# Patient Record
Sex: Female | Born: 1955
Health system: Southern US, Community
[De-identification: ages and names within clinical notes are randomized; demographics above are authoritative.]

## PROBLEM LIST (undated history)

## (undated) DIAGNOSIS — F3289 Other specified depressive episodes: Secondary | ICD-10-CM

## (undated) DIAGNOSIS — R002 Palpitations: Secondary | ICD-10-CM

## (undated) DIAGNOSIS — H409 Unspecified glaucoma: Secondary | ICD-10-CM

## (undated) DIAGNOSIS — I493 Ventricular premature depolarization: Secondary | ICD-10-CM

## (undated) DIAGNOSIS — R5383 Other fatigue: Secondary | ICD-10-CM

## (undated) DIAGNOSIS — G4700 Insomnia, unspecified: Secondary | ICD-10-CM

## (undated) DIAGNOSIS — F411 Generalized anxiety disorder: Secondary | ICD-10-CM

## (undated) DIAGNOSIS — R5381 Other malaise: Secondary | ICD-10-CM

## (undated) DIAGNOSIS — F329 Major depressive disorder, single episode, unspecified: Secondary | ICD-10-CM

## (undated) DIAGNOSIS — K5792 Diverticulitis of intestine, part unspecified, without perforation or abscess without bleeding: Secondary | ICD-10-CM

## (undated) HISTORY — DX: Generalized anxiety disorder: F41.1

## (undated) HISTORY — DX: Palpitations: R00.2

## (undated) HISTORY — DX: Other specified depressive episodes: F32.89

## (undated) HISTORY — DX: Diverticulitis of intestine, part unspecified, without perforation or abscess without bleeding: K57.92

## (undated) HISTORY — DX: Other malaise: R53.81

## (undated) HISTORY — DX: Insomnia, unspecified: G47.00

## (undated) HISTORY — PX: OTHER SURGICAL HISTORY: SHX169

## (undated) HISTORY — DX: Other malaise: R53.83

## (undated) HISTORY — DX: Major depressive disorder, single episode, unspecified: F32.9

---

## 1997-10-15 ENCOUNTER — Ambulatory Visit (HOSPITAL_COMMUNITY): Admission: RE | Admit: 1997-10-15 | Discharge: 1997-10-15 | Payer: Self-pay | Admitting: Obstetrics and Gynecology

## 1998-08-11 ENCOUNTER — Other Ambulatory Visit: Admission: RE | Admit: 1998-08-11 | Discharge: 1998-08-11 | Payer: Self-pay | Admitting: Obstetrics and Gynecology

## 1998-08-11 ENCOUNTER — Ambulatory Visit (HOSPITAL_COMMUNITY): Admission: RE | Admit: 1998-08-11 | Discharge: 1998-08-11 | Payer: Self-pay | Admitting: Orthopedic Surgery

## 1998-08-11 ENCOUNTER — Encounter: Payer: Self-pay | Admitting: Orthopedic Surgery

## 1999-01-02 ENCOUNTER — Ambulatory Visit (HOSPITAL_COMMUNITY): Admission: RE | Admit: 1999-01-02 | Discharge: 1999-01-02 | Payer: Self-pay | Admitting: Obstetrics and Gynecology

## 1999-01-22 ENCOUNTER — Encounter: Payer: Self-pay | Admitting: Obstetrics and Gynecology

## 1999-01-22 ENCOUNTER — Ambulatory Visit (HOSPITAL_COMMUNITY): Admission: RE | Admit: 1999-01-22 | Discharge: 1999-01-22 | Payer: Self-pay | Admitting: Obstetrics and Gynecology

## 1999-10-19 ENCOUNTER — Other Ambulatory Visit: Admission: RE | Admit: 1999-10-19 | Discharge: 1999-10-19 | Payer: Self-pay | Admitting: Obstetrics and Gynecology

## 2000-06-13 ENCOUNTER — Ambulatory Visit (HOSPITAL_COMMUNITY): Admission: RE | Admit: 2000-06-13 | Discharge: 2000-06-13 | Payer: Self-pay | Admitting: Obstetrics and Gynecology

## 2000-06-13 ENCOUNTER — Encounter: Payer: Self-pay | Admitting: Obstetrics and Gynecology

## 2000-11-23 ENCOUNTER — Other Ambulatory Visit: Admission: RE | Admit: 2000-11-23 | Discharge: 2000-11-23 | Payer: Self-pay | Admitting: Obstetrics and Gynecology

## 2001-06-15 ENCOUNTER — Encounter: Admission: RE | Admit: 2001-06-15 | Discharge: 2001-06-15 | Payer: Self-pay | Admitting: Obstetrics and Gynecology

## 2001-06-15 ENCOUNTER — Encounter: Payer: Self-pay | Admitting: Obstetrics and Gynecology

## 2002-01-02 ENCOUNTER — Other Ambulatory Visit: Admission: RE | Admit: 2002-01-02 | Discharge: 2002-01-02 | Payer: Self-pay | Admitting: Obstetrics and Gynecology

## 2002-07-04 ENCOUNTER — Encounter: Admission: RE | Admit: 2002-07-04 | Discharge: 2002-07-04 | Payer: Self-pay | Admitting: Obstetrics and Gynecology

## 2002-07-04 ENCOUNTER — Encounter: Payer: Self-pay | Admitting: Obstetrics and Gynecology

## 2003-02-27 ENCOUNTER — Other Ambulatory Visit: Admission: RE | Admit: 2003-02-27 | Discharge: 2003-02-27 | Payer: Self-pay | Admitting: Obstetrics and Gynecology

## 2003-07-26 ENCOUNTER — Encounter: Admission: RE | Admit: 2003-07-26 | Discharge: 2003-07-26 | Payer: Self-pay | Admitting: Obstetrics and Gynecology

## 2003-12-11 ENCOUNTER — Ambulatory Visit: Payer: Self-pay | Admitting: Internal Medicine

## 2004-02-07 ENCOUNTER — Ambulatory Visit (HOSPITAL_COMMUNITY): Admission: RE | Admit: 2004-02-07 | Discharge: 2004-02-07 | Payer: Self-pay | Admitting: Gastroenterology

## 2004-05-21 ENCOUNTER — Other Ambulatory Visit: Admission: RE | Admit: 2004-05-21 | Discharge: 2004-05-21 | Payer: Self-pay | Admitting: Obstetrics and Gynecology

## 2005-01-29 ENCOUNTER — Encounter: Admission: RE | Admit: 2005-01-29 | Discharge: 2005-01-29 | Payer: Self-pay | Admitting: Obstetrics and Gynecology

## 2006-01-31 ENCOUNTER — Encounter: Admission: RE | Admit: 2006-01-31 | Discharge: 2006-01-31 | Payer: Self-pay | Admitting: Obstetrics and Gynecology

## 2006-11-25 ENCOUNTER — Encounter: Admission: RE | Admit: 2006-11-25 | Discharge: 2006-11-25 | Payer: Self-pay | Admitting: Obstetrics and Gynecology

## 2007-07-03 ENCOUNTER — Ambulatory Visit: Payer: Self-pay | Admitting: Internal Medicine

## 2007-07-03 DIAGNOSIS — G4700 Insomnia, unspecified: Secondary | ICD-10-CM

## 2007-07-03 DIAGNOSIS — F411 Generalized anxiety disorder: Secondary | ICD-10-CM | POA: Insufficient documentation

## 2007-07-03 DIAGNOSIS — R5383 Other fatigue: Secondary | ICD-10-CM

## 2007-07-03 DIAGNOSIS — R5381 Other malaise: Secondary | ICD-10-CM

## 2007-07-03 DIAGNOSIS — F329 Major depressive disorder, single episode, unspecified: Secondary | ICD-10-CM

## 2007-07-04 LAB — CONVERTED CEMR LAB
AST: 26 units/L (ref 0–37)
BUN: 8 mg/dL (ref 6–23)
Basophils Absolute: 0 10*3/uL (ref 0.0–0.1)
Basophils Relative: 0.8 % (ref 0.0–1.0)
CO2: 28 meq/L (ref 19–32)
Calcium: 9.6 mg/dL (ref 8.4–10.5)
Eosinophils Absolute: 0.1 10*3/uL (ref 0.0–0.7)
Eosinophils Relative: 1.4 % (ref 0.0–5.0)
GFR calc Af Amer: 96 mL/min
GFR calc non Af Amer: 79 mL/min
HCT: 40.6 % (ref 36.0–46.0)
Hemoglobin: 14.2 g/dL (ref 12.0–15.0)
Lymphocytes Relative: 13.9 % (ref 12.0–46.0)
MCHC: 35 g/dL (ref 30.0–36.0)
MCV: 92.6 fL (ref 78.0–100.0)
Monocytes Absolute: 0.6 10*3/uL (ref 0.1–1.0)
Monocytes Relative: 10.2 % (ref 3.0–12.0)
Neutro Abs: 4.6 10*3/uL (ref 1.4–7.7)
Nitrite: NEGATIVE
Platelets: 213 10*3/uL (ref 150–400)
RDW: 13.1 % (ref 11.5–14.6)
Specific Gravity, Urine: <=1.005 (ref 1.000–1.03)
TSH: 2.03 microintl units/mL (ref 0.35–5.50)
Total Protein, Urine: NEGATIVE mg/dL
Urine Glucose: NEGATIVE mg/dL
Urobilinogen, UA: 0.2 (ref 0.0–1.0)

## 2008-01-04 ENCOUNTER — Encounter: Admission: RE | Admit: 2008-01-04 | Discharge: 2008-01-04 | Payer: Self-pay | Admitting: Obstetrics and Gynecology

## 2008-02-05 ENCOUNTER — Telehealth: Payer: Self-pay | Admitting: Internal Medicine

## 2008-02-27 ENCOUNTER — Telehealth: Payer: Self-pay | Admitting: Internal Medicine

## 2008-02-27 DIAGNOSIS — R002 Palpitations: Secondary | ICD-10-CM

## 2008-02-27 DIAGNOSIS — I493 Ventricular premature depolarization: Secondary | ICD-10-CM | POA: Insufficient documentation

## 2008-03-01 ENCOUNTER — Encounter: Payer: Self-pay | Admitting: Cardiology

## 2010-02-15 ENCOUNTER — Encounter: Payer: Self-pay | Admitting: Obstetrics and Gynecology

## 2010-06-12 NOTE — Op Note (Signed)
[facility]  Patient:    Isabel Adams                       MRN: [id number] Proc. Date: 01/02/99 Adm. Date:  [id number] Attending:  Frederich Balding                           Operative Report  PREOPERATIVE DIAGNOSIS:       Pelvic pain.  POSTOPERATIVE DIAGNOSIS:      Endometriosis.  OPERATION:                    Open laparoscopy with cautery of endometriotic implants.  SURGEON:                      Juluis Mire, M.D.  ASSISTANT:  ANESTHESIA:                   General endotracheal anesthesia.  ESTIMATED BLOOD LOSS:         Minimal.  PACKS AND DRAINS:             None.  BLOOD REPLACED:               None.  COMPLICATIONS:                None.  INDICATIONS:                  Dictated in the history and physical.  DESCRIPTION OF PROCEDURE:     The patient was taken to the OR and placed in the  supine position.  After a satisfactory level of general endotracheal anesthesia was obtained, the patient was placed in the dorsal lithotomy position using the Allen stirrups.  The abdomen, perineum, and vagina were prepped with Betadine. Examination under anesthesia revealed the uterus to be posterior, upper limits f normal size, adnexa unremarkable.  The bladder was emptied by in-and-out catheterization.  Hulka tenaculum was put in place.  The patient draped out for  laparoscopy.  A subumbilical incision was made with the knife.  The incision was extended through the subcutaneous tissue.  The fascia was identified and entered sharply.  The incision was extended laterally.  The peritoneum was identified and entered sharply.  The Hasson cannula was put in place and secured with two lateral sutures of 0 Vicryl.  The abdomen was inflated with carbon dioxide.  The operating laparoscope was then introduced.  There was no evidence of perumbilical adhesions or injury to adjacent organs.  The 5 mm trocar was put in place in the suprapubic area  under direct visualization.  Visualization revealed the appendix to be surgically absent.  There were some pericecal adhesions.  The upper abdomen including the liver and tip of the gallbladder were unremarkable.  Both lateral  gutters were otherwise clear.  The uterus was retroverted, upper limits of normal size, irregular consistent with adenomyosis.  She had a peritoneal defect in the left side of the cul-de-sac near the uterosacral ligament.  This was cauterized and had superficial implants of endometriosis on the left ovary.  The right ovary was otherwise clear.  There was no other evidence of active endometriosis.  All areas were adequately cauterized.  No evidence of injury to adjacent organs.  The abdomen was deflated of its carbon dioxide.  All trocars were removed.  The subumbilical fascia was closed with figure-of-eight suture of  0 Vicryl.  The skin was closed  with interrupted subcuticulars of 4-0 Vicryl.  The suprapubic incision was closed with Steri-Strips.  The Hulka tenaculum was then removed.  The patient taken out of the dorsal lithotomy position and once alert and extubated, transferred to the recovery room in good condition.  Sponge, needle, and instrument counts were correct by circulating nurse. DD:  01/02/99 TD:  01/04/99 Job: [id number]

## 2011-01-06 ENCOUNTER — Encounter: Payer: Self-pay | Admitting: Cardiovascular Disease

## 2011-01-06 ENCOUNTER — Encounter: Payer: Self-pay | Admitting: *Deleted

## 2011-01-07 ENCOUNTER — Ambulatory Visit (INDEPENDENT_AMBULATORY_CARE_PROVIDER_SITE_OTHER): Payer: Self-pay | Admitting: Cardiovascular Disease

## 2011-01-07 ENCOUNTER — Encounter: Payer: Self-pay | Admitting: Cardiovascular Disease

## 2011-01-07 DIAGNOSIS — F411 Generalized anxiety disorder: Secondary | ICD-10-CM

## 2011-01-07 DIAGNOSIS — R002 Palpitations: Secondary | ICD-10-CM

## 2011-01-07 DIAGNOSIS — R079 Chest pain, unspecified: Secondary | ICD-10-CM | POA: Insufficient documentation

## 2011-01-07 NOTE — Assessment & Plan Note (Signed)
Atypical with normal ECG and exam  F/U stress echo

## 2011-01-07 NOTE — Progress Notes (Signed)
Anxious 55 yo with no previous cardiac history.  48 hrs of atypical chest pain, fatigue and malaise  Started on Tuesday while playing tennis.  Pain started in arm and went to chest and shoulders.  Had malaise.  No pleurit pain or dyspnea.  Has had occasional palpitations or fluttering in chest.  No syncope.  Plays tennis 2-3 x/week usually and no issues or problems.  Has had palpitatoins in past with no identifiable cardiac issues.  Sees [facility] as primary  Denies history of thyroid problems.  Feels normal and asymptomatic today   ROS: Denies fever, malais, weight loss, blurry vision, decreased visual acuity, cough, sputum, SOB, hemoptysis, pleuritic pain, palpitaitons, heartburn, abdominal pain, melena, lower extremity edema, claudication, or rash.  All other systems reviewed and negative   General: Affect appropriate Healthy:  appears stated age HEENT: normal Neck supple with no adenopathy JVP normal no bruits no thyromegaly Lungs clear with no wheezing and good diaphragmatic motion Heart:  S1/S2 no murmur,rub, gallop or click PMI normal Abdomen: benighn, BS positve, no tenderness, no AAA no bruit.  No HSM or HJR Distal pulses intact with no bruits No edema Neuro non-focal Skin warm and dry No muscular weakness  Medications Current Outpatient Prescriptions  Medication Sig Dispense Refill  . ALPRAZolam (XANAX) 0.5 MG tablet Take 0.5 mg by mouth at bedtime as needed.        . cholecalciferol (VITAMIN D) 1000 UNITS tablet Take 1,000 Units by mouth daily.          Allergies Paroxetine  Family History: Family History  Problem Relation Age of Onset  . Hypertension    . Bipolar disorder      Social History: History   Social History  . Marital Status: Married    Spouse Name: N/A    Number of Children: N/A  . Years of Education: N/A   Occupational History  . Not on file.   Social History Main Topics  . Smoking status: Never Smoker   . Smokeless tobacco: Not on file  .  Alcohol Use: Not on file  . Drug Use: Not on file  . Sexually Active: Not on file   Other Topics Concern  . Not on file   Social History Narrative  . No narrative on file    Electrocardiogram:   NSR rate 69  Possible LAE othewise normal ECG  Assessment and Plan

## 2011-01-07 NOTE — Assessment & Plan Note (Signed)
Benign and self limited No need for event monitor unless stress echo abnormal

## 2011-01-07 NOTE — Patient Instructions (Signed)
Your physician has requested that you have a stress echocardiogram. For further information please visit [email or web address]. Please follow instruction sheet as given.  Your physician recommends that you schedule a follow-up appointment in: as needed  

## 2011-01-07 NOTE — Assessment & Plan Note (Signed)
Seems to be persistant problem.  F/U primary and continue current meds

## 2011-01-13 ENCOUNTER — Ambulatory Visit (HOSPITAL_BASED_OUTPATIENT_CLINIC_OR_DEPARTMENT_OTHER): Payer: 59 | Admitting: Radiology

## 2011-01-13 ENCOUNTER — Ambulatory Visit (HOSPITAL_COMMUNITY): Payer: 59 | Attending: Cardiology | Admitting: Radiology

## 2011-01-13 DIAGNOSIS — R002 Palpitations: Secondary | ICD-10-CM | POA: Insufficient documentation

## 2011-01-13 DIAGNOSIS — R5381 Other malaise: Secondary | ICD-10-CM | POA: Insufficient documentation

## 2011-01-13 DIAGNOSIS — R072 Precordial pain: Secondary | ICD-10-CM

## 2011-01-13 DIAGNOSIS — R0989 Other specified symptoms and signs involving the circulatory and respiratory systems: Secondary | ICD-10-CM

## 2011-01-13 DIAGNOSIS — R079 Chest pain, unspecified: Secondary | ICD-10-CM | POA: Insufficient documentation

## 2011-01-13 DIAGNOSIS — R5383 Other fatigue: Secondary | ICD-10-CM | POA: Insufficient documentation

## 2011-01-14 ENCOUNTER — Telehealth: Payer: Self-pay | Admitting: Cardiovascular Disease

## 2011-01-14 NOTE — Telephone Encounter (Signed)
New msg Pt is calling about stress test results She said you a can leave a message. She wanted to know what activities she can continue, such as jogging.

## 2011-01-15 NOTE — Telephone Encounter (Signed)
PT  AWARE OF STRESS ECHO RESULTS ./CY 

## 2011-09-09 ENCOUNTER — Telehealth: Payer: Self-pay | Admitting: Cardiovascular Disease

## 2011-09-09 NOTE — Telephone Encounter (Signed)
Notified pt OK to take adderol

## 2011-09-09 NOTE — Telephone Encounter (Signed)
New msg Pt is possibly starting adderal med and she wants to know if it would cause pvc's. Please call

## 2012-10-31 ENCOUNTER — Other Ambulatory Visit: Payer: Self-pay | Admitting: Obstetrics and Gynecology

## 2012-10-31 DIAGNOSIS — N6489 Other specified disorders of breast: Secondary | ICD-10-CM

## 2012-10-31 DIAGNOSIS — N644 Mastodynia: Secondary | ICD-10-CM

## 2012-11-08 ENCOUNTER — Ambulatory Visit
Admission: RE | Admit: 2012-11-08 | Discharge: 2012-11-08 | Disposition: A | Discharge status: Home / Self Care | Payer: 59 | Source: Ambulatory Visit | Attending: Obstetrics and Gynecology | Admitting: Obstetrics and Gynecology

## 2012-11-08 DIAGNOSIS — N644 Mastodynia: Secondary | ICD-10-CM

## 2012-11-08 DIAGNOSIS — N6489 Other specified disorders of breast: Secondary | ICD-10-CM

## 2013-01-24 ENCOUNTER — Encounter: Payer: Self-pay | Admitting: Cardiology

## 2013-07-21 ENCOUNTER — Emergency Department (HOSPITAL_COMMUNITY)
Admission: EM | Admit: 2013-07-21 | Discharge: 2013-07-21 | Disposition: A | Discharge status: Home / Self Care | Payer: 59 | Attending: Emergency Medicine | Admitting: Emergency Medicine

## 2013-07-21 ENCOUNTER — Encounter (HOSPITAL_COMMUNITY): Payer: Self-pay | Admitting: Emergency Medicine

## 2013-07-21 DIAGNOSIS — Z79899 Other long term (current) drug therapy: Secondary | ICD-10-CM | POA: Insufficient documentation

## 2013-07-21 DIAGNOSIS — F329 Major depressive disorder, single episode, unspecified: Secondary | ICD-10-CM | POA: Insufficient documentation

## 2013-07-21 DIAGNOSIS — H409 Unspecified glaucoma: Secondary | ICD-10-CM | POA: Insufficient documentation

## 2013-07-21 DIAGNOSIS — Z8679 Personal history of other diseases of the circulatory system: Secondary | ICD-10-CM | POA: Insufficient documentation

## 2013-07-21 DIAGNOSIS — F3289 Other specified depressive episodes: Secondary | ICD-10-CM | POA: Insufficient documentation

## 2013-07-21 DIAGNOSIS — F411 Generalized anxiety disorder: Secondary | ICD-10-CM | POA: Insufficient documentation

## 2013-07-21 DIAGNOSIS — N390 Urinary tract infection, site not specified: Secondary | ICD-10-CM

## 2013-07-21 HISTORY — DX: Ventricular premature depolarization: I49.3

## 2013-07-21 HISTORY — DX: Unspecified glaucoma: H40.9

## 2013-07-21 LAB — URINALYSIS, ROUTINE W REFLEX MICROSCOPIC
Bilirubin Urine: NEGATIVE
GLUCOSE, UA: NEGATIVE mg/dL
Ketones, ur: NEGATIVE mg/dL
NITRITE: NEGATIVE
PROTEIN: 100 mg/dL — AB
SPECIFIC GRAVITY, URINE: 1.021 (ref 1.005–1.030)
Urobilinogen, UA: 1 mg/dL (ref 0.0–1.0)
pH: 6 (ref 5.0–8.0)

## 2013-07-21 LAB — URINE MICROSCOPIC-ADD ON

## 2013-07-21 MED ORDER — PHENAZOPYRIDINE HCL 200 MG PO TABS: 200.0000 mg | ORAL_TABLET | Freq: Three times a day (TID) | ORAL | Status: DC | PRN

## 2013-07-21 MED ORDER — CEPHALEXIN 500 MG PO CAPS
1000.0000 mg | ORAL_CAPSULE | Freq: Once | ORAL | Status: AC
  Administered 2013-07-21: 1000 mg via ORAL
  Filled 2013-07-21: qty 2

## 2013-07-21 MED ORDER — PHENAZOPYRIDINE HCL 200 MG PO TABS
200.0000 mg | ORAL_TABLET | Freq: Three times a day (TID) | ORAL | Status: DC
  Administered 2013-07-21: 200 mg via ORAL
  Filled 2013-07-21: qty 1

## 2013-07-21 MED ORDER — CEPHALEXIN 500 MG PO CAPS: 500.0000 mg | ORAL_CAPSULE | Freq: Three times a day (TID) | ORAL | Status: DC

## 2013-07-21 NOTE — ED Notes (Signed)
Pt states she thinks she has a UTI  Pt states yesterday morning she started having some burning with urination  Pt states it has progressively gotten worse throughout the day and tonight she started having blood in her urine  Pt states she is having a great amt of discomfort

## 2013-07-21 NOTE — ED Notes (Signed)
MD at bedside. 

## 2013-07-21 NOTE — ED Provider Notes (Signed)
CSN: [id number]     Arrival date & time 07/21/13  0202 History   First MD Initiated Contact with Patient 07/21/13 0305     Chief Complaint  Patient presents with  . Hematuria     (Consider location/radiation/quality/duration/timing/severity/associated sxs/prior Treatment) HPI 58 yo woman with about 12 hours of urinary frequency, burning dysuria, hesitancy and slight amount of gross hematuria. No fever. Mildly nauseated. No vomiting. Pain is worse with urination. No fever.   Past Medical History  Diagnosis Date  . Palpitations   . INSOMNIA, PERSISTENT   . FATIGUE   . DEPRESSION   . ANXIETY   . Chest pain   . PVC (premature ventricular contraction)   . Glaucoma    Past Surgical History  Procedure Laterality Date  . Laproscopy    . Ovarian cyst removed     Family History  Problem Relation Age of Onset  . Hypertension    . Bipolar disorder    . Hypertension Mother    History  Substance Use Topics  . Smoking status: Never Smoker   . Smokeless tobacco: Not on file  . Alcohol Use: Yes     Comment: occ   OB History   Grav Para Term Preterm Abortions TAB SAB Ect Mult Living                 Review of Systems 10 point ROS obtained and is negative with the exception of sx noted ablve.     Allergies  Paroxetine  Home Medications   Prior to Admission medications   Medication Sig Start Date End Date Taking? Authorizing Provider  acetaminophen (TYLENOL) 500 MG tablet Take 1,000 mg by mouth every 6 (six) hours as needed for moderate pain.   Yes Historical Provider, MD  ALPRAZolam Prudy Feeler(XANAX) 0.5 MG tablet Take 0.5 mg by mouth at bedtime as needed for anxiety.    Yes Historical Provider, MD   BP 114/73  Temp(Src) 97.7 F (36.5 C) (Oral)  Resp 20  SpO2 97% Physical Exam  Constitutional: She is oriented to person, place, and time. She appears well-developed and well-nourished.  HENT:  Head: Normocephalic and atraumatic.  Eyes: Conjunctivae and EOM are normal. Pupils are  equal, round, and reactive to light.  Neck: Neck supple.  Cardiovascular: Normal rate, regular rhythm and normal heart sounds.   Pulmonary/Chest: Effort normal and breath sounds normal.  Abdominal: Soft. She exhibits no distension. There is no tenderness.  Musculoskeletal: She exhibits no edema.  Neurological: She is alert and oriented to person, place, and time.  Skin: Skin is warm and dry.  Psychiatric: She has a normal mood and affect.   \  ED Course  Procedures (including critical care time) Labs Review Labs Reviewed  URINALYSIS, ROUTINE W REFLEX MICROSCOPIC - Abnormal; Notable for the following:    Color, Urine BROWN (*)    APPearance TURBID (*)    Hgb urine dipstick LARGE (*)    Protein, ur 100 (*)    Leukocytes, UA LARGE (*)    All other components within normal limits  URINE MICROSCOPIC-ADD ON - Abnormal; Notable for the following:    Bacteria, UA MANY (*)    All other components within normal limits  URINE CULTURE     MDM   Patient with simple UTI. We will tx empirically with Keflex - 1st dose in ED, along with Pyridium. Urine sent for C and S. Patient given return precautions.     Brandt LoosenJulie Shelbee Apgar, MD 07/26/13 [phone]

## 2013-07-23 LAB — URINE CULTURE: Colony Count: >=100000

## 2013-07-28 ENCOUNTER — Telehealth (HOSPITAL_BASED_OUTPATIENT_CLINIC_OR_DEPARTMENT_OTHER): Payer: Self-pay | Admitting: Emergency Medicine

## 2013-07-28 NOTE — Telephone Encounter (Signed)
Post ED Visit - Positive Culture Follow-up  Culture report reviewed by antimicrobial stewardship pharmacist: []  Wes Dulaney, Pharm.D., BCPS []  Celedonio MiyamotoJeremy Frens, Pharm.D., BCPS [x]  Georgina PillionElizabeth Martin, Pharm.D., BCPS []  De SotoMinh Pham, [address] Rainbow BoulevardPharm.D., BCPS, AAHIVP []  Estella HuskMichelle Turner, Pharm.D., BCPS, AAHIVP  Positive urine culture Treated with Keflex, organism sensitive to the same and no further patient follow-up is required at this time.  Marcelle OverlieHolland, Jenel LucksKylie 07/28/2013, 10:33 AM

## 2017-12-20 ENCOUNTER — Ambulatory Visit: Payer: 59 | Admitting: Family Medicine

## 2017-12-20 DIAGNOSIS — M7632 Iliotibial band syndrome, left leg: Secondary | ICD-10-CM | POA: Diagnosis not present

## 2017-12-20 NOTE — Progress Notes (Signed)
Isabel Adams [address] [place], [place] [address] Phone: [phone] Subjective:     CC: Left knee pain  [id number]  Isabel Adams is a 62 y.o. female coming in with complaint of left knee pain. She was in the yard with her dog yesterday when she felt her knee tighten up. She said that she has had this happen to her before. Pain is over lateral aspect of knee with weight bearing. Pain is 0/10 today. Was using turmeric and stopped taking it.  Patient states overall most of time a mild discomfort but sometimes can get severe enough that stops her from activity.       Past Medical History:  Diagnosis Date  . ANXIETY   . Chest pain   . DEPRESSION   . FATIGUE   . Glaucoma   . INSOMNIA, PERSISTENT   . Palpitations   . PVC (premature ventricular contraction)    Past Surgical History:  Procedure Laterality Date  . laproscopy    . ovarian cyst removed     Social History   Socioeconomic History  . Marital status: Married    Spouse name: Not on file  . Number of children: Not on file  . Years of education: Not on file  . Highest education level: Not on file  Occupational History  . Not on file  Social Needs  . Financial resource strain: Not on file  . Food insecurity:    Worry: Not on file    Inability: Not on file  . Transportation needs:    Medical: Not on file    Non-medical: Not on file  Tobacco Use  . Smoking status: Never Smoker  Substance and Sexual Activity  . Alcohol use: Yes    Comment: occ  . Drug use: No  . Sexual activity: Not on file  Lifestyle  . Physical activity:    Days per week: Not on file    Minutes per session: Not on file  . Stress: Not on file  Relationships  . Social connections:    Talks on phone: Not on file    Gets together: Not on file    Attends religious service: Not on file    Active member of club or organization: Not on file    Attends meetings of clubs or organizations: Not on file   Relationship status: Not on file  Other Topics Concern  . Not on file  Social History Narrative  . Not on file   Allergies  Allergen Reactions  . Paroxetine     REACTION: rash   Family History  Problem Relation Age of Onset  . Hypertension Unknown   . Bipolar disorder Unknown   . Hypertension Mother        Current Outpatient Medications (Analgesics):  .  acetaminophen (TYLENOL) 500 MG tablet, Take 1,000 mg by mouth every 6 (six) hours as needed for moderate pain.   Current Outpatient Medications (Other):  Marland Kitchen  ALPRAZolam (XANAX) 0.5 MG tablet, Take 0.5 mg by mouth at bedtime as needed for anxiety.  .  cephALEXin (KEFLEX) 500 MG capsule, Take 1 capsule (500 mg total) by mouth 3 (three) times daily. .  phenazopyridine (PYRIDIUM) 200 MG tablet, Take 1 tablet (200 mg total) by mouth 3 (three) times daily as needed for pain.    Past medical history, social, surgical and family history all reviewed in electronic medical record.  No pertanent information unless stated regarding to the chief complaint.  Review of Systems:  No headache, visual changes, nausea, vomiting, diarrhea, constipation, dizziness, abdominal pain, skin rash, fevers, chills, night sweats, weight loss, swollen lymph nodes, body aches, joint swelling, muscle aches, chest pain, shortness of breath, mood changes.   Objective  Blood pressure 118/72, pulse 63, height 5\' 9"  (1.753 m), weight 149 lb (67.6 kg), SpO2 98 %.    General: No apparent distress alert and oriented x3 mood and affect normal, dressed appropriately.  HEENT: Pupils equal, extraocular movements intact  Respiratory: Patient's speak in full sentences and does not appear short of breath  Cardiovascular: No lower extremity edema, non tender, no erythema  Skin: Warm dry intact with no signs of infection or rash on extremities or on axial skeleton.  Abdomen: Soft nontender  Neuro: Cranial nerves II through XII are intact, neurovascularly intact in all  extremities with 2+ DTRs and 2+ pulses.  Lymph: No lymphadenopathy of posterior or anterior cervical chain or axillae bilaterally.  Gait normal with good balance and coordination.  MSK:  Non tender with full range of motion and good stability and symmetric strength and tone of shoulders, elbows, wrist, hip, and ankles bilaterally.  Knee: Left Normal to inspection with no erythema or effusion or obvious bony abnormalities. Palpation normal with no warmth, joint line tenderness, patellar tenderness, or condyle tenderness. ROM full in flexion and extension and lower leg rotation. Ligaments with solid consistent endpoints including ACL, PCL, LCL, MCL. Negative Mcmurray's, Apley's, and Thessalonian tests. Non painful patellar compression. Patellar glide without crepitus. Patellar and quadriceps tendons unremarkable. Hamstring and quadriceps strength is normal. Tightness with Pearlean BrownieFaber test on the left side    Contralateral knee unremarkable  MSK US performed of: Left knee This study was ordered, performed, and interpreted by Terrilee FilesZach  D.O.  Knee: Patient does have manias partial tearing at the insertion of the IT band and near the LCL.  LCL does appear to be intact.  Minimal arthritic changes noted of the knee overall.  No joint effusion noted. Impression: Chronic IT band syndrome  [id number]; 15 additional minutes spent for Therapeutic exercises as stated in above notes.  This included exercises focusing on stretching, strengthening, with significant focus on eccentric aspects.   Long term goals include an improvement in range of motion, strength, endurance as well as avoiding reinjury. Patient's frequency would include in 1-2 times a day, 3-5 times a week for a duration of 6-12 weeks. I reviewed with the patient the anatomy involved with ITB syndrome.  Given rehab program  - primarily ITB stretching program, core stability program, gluteus medius strengthening, hip flexion strengthening, and core.  Additional proprioception and mild plyometrics program reviewed.  Reviewed plan of care and rehab is the primary treatment in this condition.  Proper technique shown and discussed handout in great detail with ATC.  All questions were discussed and answered.     Impression and Recommendations:     This case required medical decision making of moderate complexity. The above documentation has been reviewed and is accurate and complete Judi SaaZachary M , DO       Note: This dictation was prepared with Dragon dictation along with smaller phrase technology. Any transcriptional errors that result from this process are unintentional.

## 2017-12-20 NOTE — Assessment & Plan Note (Signed)
IT band syndrome.  Seems to be more distal.  Discussed icing regimen and home exercise.  Discussed which activities to do which wants to avoid.  Patient will increase activity slowly over the course the next several weeks.  Follow-up with me again in 4 to 8 weeks.

## 2017-12-20 NOTE — Patient Instructions (Signed)
Good to see you  A little tear of the IT band, the knee looks good Ice 20 minutes 2 times daily. Usually after activity and before bed. Exercises 3 times a week.  pennsaid pinkie amount topically 2 times daily as needed.  Over the counter get  Vitamin D 2000 IU daily  Turmeric 500mg  daily  Elliptical may be better than walking  See me again in 4-6 weeks Happy holidays!

## 2018-01-23 ENCOUNTER — Other Ambulatory Visit: Payer: Self-pay | Admitting: Radiology

## 2018-01-23 ENCOUNTER — Ambulatory Visit
Admission: RE | Admit: 2018-01-23 | Discharge: 2018-01-23 | Disposition: A | Discharge status: Home / Self Care | Payer: 59 | Source: Ambulatory Visit | Attending: Radiology | Admitting: Radiology

## 2018-01-23 DIAGNOSIS — N6489 Other specified disorders of breast: Secondary | ICD-10-CM

## 2018-01-24 ENCOUNTER — Other Ambulatory Visit: Payer: 59

## 2018-01-30 ENCOUNTER — Telehealth: Payer: Self-pay

## 2018-01-30 NOTE — Telephone Encounter (Signed)
Copied from CRM [id number]. Topic: Appointment Scheduling - Scheduling Inquiry for Clinic >> Jan 30, 2018 12:10 PM Angela Nevin wrote: Reason for CRM: Patient would like to know if Dr. Katrinka Blazing would be willing to work her in later in the day on 01/10. Patient is scheduled for 11:30. Please advise.

## 2018-01-30 NOTE — Telephone Encounter (Signed)
Spoke with patient about rescheduling but Friday at 11:30pm does work for her. No changes made.

## 2018-01-31 ENCOUNTER — Ambulatory Visit: Payer: 59 | Admitting: Family Medicine

## 2018-02-01 NOTE — Progress Notes (Signed)
[facility] [address] [place], [place] [address] Phone: [phone] Subjective:       CC: Leg and hip pain follow-up  DQQ:[id number]  Isabel Adams is a 63 y.o. female coming in with complaint of patient was more of a chronic IT band syndrome.  Patient was given different exercises, discussed over-the-counter medications, icing regimen.  Patient states that her pain is improving but patient is frustrated as she feels like she should be better. A couple of weeks ago she felt a catching on the lateral aspect of the knee. Patient has been favoring her left leg and is now having pain in the right leg. Has tried the exercises a few times.     Past Medical History:  Diagnosis Date  . ANXIETY   . Chest pain   . DEPRESSION   . FATIGUE   . Glaucoma   . INSOMNIA, PERSISTENT   . Palpitations   . PVC (premature ventricular contraction)    Past Surgical History:  Procedure Laterality Date  . laproscopy    . ovarian cyst removed     Social History   Socioeconomic History  . Marital status: Married    Spouse name: Not on file  . Number of children: Not on file  . Years of education: Not on file  . Highest education level: Not on file  Occupational History  . Not on file  Social Needs  . Financial resource strain: Not on file  . Food insecurity:    Worry: Not on file    Inability: Not on file  . Transportation needs:    Medical: Not on file    Non-medical: Not on file  Tobacco Use  . Smoking status: Never Smoker  Substance and Sexual Activity  . Alcohol use: Yes    Comment: occ  . Drug use: No  . Sexual activity: Not on file  Lifestyle  . Physical activity:    Days per week: Not on file    Minutes per session: Not on file  . Stress: Not on file  Relationships  . Social connections:    Talks on phone: Not on file    Gets together: Not on file    Attends religious service: Not on file    Active member of club or organization: Not on  file    Attends meetings of clubs or organizations: Not on file    Relationship status: Not on file  Other Topics Concern  . Not on file  Social History Narrative  . Not on file   Allergies  Allergen Reactions  . Paroxetine     REACTION: rash   Family History  Problem Relation Age of Onset  . Hypertension Other   . Bipolar disorder Other   . Hypertension Mother        Current Outpatient Medications (Analgesics):  .  acetaminophen (TYLENOL) 500 MG tablet, Take 1,000 mg by mouth every 6 (six) hours as needed for moderate pain.   Current Outpatient Medications (Other):  Marland Kitchen  ALPRAZolam (XANAX) 0.5 MG tablet, Take 0.5 mg by mouth at bedtime as needed for anxiety.  .  cephALEXin (KEFLEX) 500 MG capsule, Take 1 capsule (500 mg total) by mouth 3 (three) times daily. .  phenazopyridine (PYRIDIUM) 200 MG tablet, Take 1 tablet (200 mg total) by mouth 3 (three) times daily as needed for pain.    Past medical history, social, surgical and family history all reviewed in electronic medical record.  No pertanent information unless stated regarding to the chief complaint.   Review of Systems:  No headache, visual changes, nausea, vomiting, diarrhea, constipation, dizziness, abdominal pain, skin rash, fevers, chills, night sweats, weight loss, swollen lymph nodes, body aches, joint swelling, , chest pain, shortness of breath, mood changes.  Positive muscle aches  Objective  Blood pressure 118/72, pulse 68, height 5\' 9"  (1.753 m), weight 153 lb (69.4 kg), SpO2 98 %.    General: No apparent distress alert and oriented x3 mood and affect normal, dressed appropriately.  HEENT: Pupils equal, extraocular movements intact  Respiratory: Patient's speak in full sentences and does not appear short of breath  Cardiovascular: No lower extremity edema, non tender, no erythema  Skin: Warm dry intact with no signs of infection or rash on extremities or on axial skeleton.  Abdomen: Soft nontender    Neuro: Cranial nerves II through XII are intact, neurovascularly intact in all extremities with 2+ DTRs and 2+ pulses.  Lymph: No lymphadenopathy of posterior or anterior cervical chain or axillae bilaterally.  Gait antalgic MSK:  Non tender with full range of motion and good stability and symmetric strength and tone of shoulders, elbows, wrist, hip, and ankles bilaterally.  Hip: Left ROM IR: 35 Deg, ER: 25 Deg, Flexion: 80 Deg, Extension: 100 Deg, Abduction: 35 Deg, Adduction: 35 Deg Strength IR: 5/5, ER: 5/5, Flexion: 5/5, Extension: 5/5, Abduction: 5/5, Adduction: 4/5 Pelvic alignment unremarkable to inspection and palpation. Standing hip rotation and gait without trendelenburg sign / unsteadiness. Positive Faber  Left knee-patient still has pain on the lateral aspect of the knee.  Tightness with the [facility] test.  Mild pain over the lateral joint line.  Mild bogginess noted   Ltd msk  Patient is limited US of lateral knee sho possible inflammation vs cyst formation within distal IT band.  Does not appear to be intra-articular. Not associated with meniscus.  IMpressoin: chronic IT band   Procedure: Real-time Ultrasound Guided Injection of left knee cyst lateral  Device: GE Logiq Q7 Ultrasound guided injection is preferred based studies that show increased duration, increased effect, greater accuracy, decreased procedural pain, increased response rate, and decreased cost with ultrasound guided versus blind injection.  Verbal informed consent obtained.  Time-out conducted.  Noted no overlying erythema, induration, or other signs of local infection.  Skin prepped in a sterile fashion.  Local anesthesia: Topical Ethyl chloride.  With sterile technique and under real time ultrasound guidance: With a 22-gauge 2 inch needle patient was injected with 4 cc of 0.5% Marcaine and 1 cc of Kenalog 40 mg/dL. This was from a lateral approach.  Completed without difficulty  Pain immediately resolved  suggesting accurate placement of the medication.  Advised to call if fevers/chills, erythema, induration, drainage, or persistent bleeding.  Images permanently stored and available for review in the ultrasound unit.  Impression: Technically successful ultrasound guided injection.     Impression and Recommendations:     This case required medical decision making of moderate complexity. The above documentation has been reviewed and is accurate and complete Judi Saa, DO       Note: This dictation was prepared with Dragon dictation along with smaller phrase technology. Any transcriptional errors that result from this process are unintentional.

## 2018-02-03 ENCOUNTER — Ambulatory Visit: Payer: Self-pay

## 2018-02-03 ENCOUNTER — Other Ambulatory Visit (INDEPENDENT_AMBULATORY_CARE_PROVIDER_SITE_OTHER): Payer: 59

## 2018-02-03 ENCOUNTER — Ambulatory Visit: Payer: 59 | Admitting: Family Medicine

## 2018-02-03 VITALS — BP 118/72 | HR 68 | Ht 69.0 in | Wt 153.0 lb

## 2018-02-03 DIAGNOSIS — G8929 Other chronic pain: Secondary | ICD-10-CM

## 2018-02-03 DIAGNOSIS — M7632 Iliotibial band syndrome, left leg: Secondary | ICD-10-CM | POA: Diagnosis not present

## 2018-02-03 DIAGNOSIS — M25562 Pain in left knee: Principal | ICD-10-CM

## 2018-02-03 DIAGNOSIS — M2550 Pain in unspecified joint: Secondary | ICD-10-CM

## 2018-02-03 LAB — CBC WITH DIFFERENTIAL/PLATELET
Basophils Absolute: 0 10*3/uL (ref 0.0–0.1)
Basophils Relative: 0.9 % (ref 0.0–3.0)
Eosinophils Absolute: 0.3 10*3/uL (ref 0.0–0.7)
Eosinophils Relative: 5.6 % — ABNORMAL HIGH (ref 0.0–5.0)
HCT: 41.2 % (ref 36.0–46.0)
Hemoglobin: 13.7 g/dL (ref 12.0–15.0)
Lymphocytes Relative: 21.2 % (ref 12.0–46.0)
Lymphs Abs: 1.1 10*3/uL (ref 0.7–4.0)
MCHC: 33.2 g/dL (ref 30.0–36.0)
MCV: 95.1 fl (ref 78.0–100.0)
MONOS PCT: 9 % (ref 3.0–12.0)
Monocytes Absolute: 0.4 10*3/uL (ref 0.1–1.0)
Neutro Abs: 3.1 10*3/uL (ref 1.4–7.7)
Neutrophils Relative %: 63.3 % (ref 43.0–77.0)
Platelets: 235 10*3/uL (ref 150.0–400.0)
RBC: 4.33 Mil/uL (ref 3.87–5.11)
RDW: 13.6 % (ref 11.5–15.5)
WBC: 5 10*3/uL (ref 4.0–10.5)

## 2018-02-03 LAB — COMPREHENSIVE METABOLIC PANEL
ALT: 15 U/L (ref 0–35)
AST: 16 U/L (ref 0–37)
Albumin: 4.4 g/dL (ref 3.5–5.2)
Alkaline Phosphatase: 67 U/L (ref 39–117)
BILIRUBIN TOTAL: 0.4 mg/dL (ref 0.2–1.2)
BUN: 15 mg/dL (ref 6–23)
CO2: 22 mEq/L (ref 19–32)
Calcium: 9.4 mg/dL (ref 8.4–10.5)
Chloride: 110 mEq/L (ref 96–112)
Creatinine, Ser: 0.74 mg/dL (ref 0.40–1.20)
GFR: 83.8 mL/min (ref >60.00)
Glucose, Bld: 106 mg/dL — ABNORMAL HIGH (ref 70–99)
Potassium: 4.6 mEq/L (ref 3.5–5.1)
Sodium: 140 mEq/L (ref 135–145)
Total Protein: 6.6 g/dL (ref 6.0–8.3)

## 2018-02-03 LAB — IBC PANEL
Iron: 100 ug/dL (ref 42–145)
Saturation Ratios: 28.8 % (ref 20.0–50.0)
Transferrin: 248 mg/dL (ref 212.0–360.0)

## 2018-02-03 LAB — C-REACTIVE PROTEIN: CRP: <0.1 mg/dL — ABNORMAL LOW (ref 0.5–20.0)

## 2018-02-03 LAB — SEDIMENTATION RATE: Sed Rate: 2 mm/hr (ref 0–30)

## 2018-02-03 LAB — URIC ACID: Uric Acid, Serum: 4.8 mg/dL (ref 2.4–7.0)

## 2018-02-03 LAB — FERRITIN: Ferritin: 52.4 ng/mL (ref 10.0–291.0)

## 2018-02-03 LAB — TSH: TSH: 2.26 u[IU]/mL (ref 0.35–4.50)

## 2018-02-03 NOTE — Patient Instructions (Addendum)
Good to see you  Ice is yoru friend Take a couple days form the exercise Lets get labs downstairs  Then continue the exercises downstairs See me again in 6 weeks

## 2018-02-06 LAB — RHEUMATOID FACTOR: Rheumatoid fact SerPl-aCnc: 19 IU/mL — ABNORMAL HIGH (ref <14)

## 2018-02-06 LAB — ANGIOTENSIN CONVERTING ENZYME: Angiotensin-Converting Enzyme: 23 U/L (ref 9–67)

## 2018-02-06 LAB — PTH, INTACT AND CALCIUM
Calcium: 9.5 mg/dL (ref 8.6–10.4)
PTH: 62 pg/mL (ref 14–64)

## 2018-02-06 LAB — CYCLIC CITRUL PEPTIDE ANTIBODY, IGG: Cyclic Citrullin Peptide Ab: <16 UNITS

## 2018-02-06 LAB — CALCIUM, IONIZED: Calcium, Ion: 5.21 mg/dL (ref 4.8–5.6)

## 2018-02-06 LAB — ANA: Anti Nuclear Antibody(ANA): NEGATIVE

## 2018-02-11 ENCOUNTER — Encounter: Payer: Self-pay | Admitting: Family Medicine

## 2018-02-28 ENCOUNTER — Telehealth: Payer: Self-pay | Admitting: *Deleted

## 2018-02-28 NOTE — Progress Notes (Signed)
[facility] D[facility] [address] [place], [place] [address] Phone: [phone] Subjective:    I Isabel NighKana Thompson am serving as a Neurosurgeonscribe for Dr. Antoine PrimasZachary Smith.   CC: Left knee pain follow-up  VOZ:[id number]:Subjective    Updated 03/01/2018  Isabel SiresSusan W Adams is a 63 y.o. female coming in with complaint of left knee pain. States she feels about the same. Right leg is sore. Left knee catches and has a burning sensation up into the glut. Patient was seen and had more ones appear to be a peri-meniscal cyst.  Did have aspiration done.  Patient states that the pain seemed to be better initially but now is having some mild worsening again.  Patient states that it seems to be radiating up more.  Seems to be even in the middle of her left buttocks.  No significant back pain that seems to be associated with it though.     Past Medical History:  Diagnosis Date  . ANXIETY   . Chest pain   . DEPRESSION   . FATIGUE   . Glaucoma   . INSOMNIA, PERSISTENT   . Palpitations   . PVC (premature ventricular contraction)    Past Surgical History:  Procedure Laterality Date  . laproscopy    . ovarian cyst removed     Social History   Socioeconomic History  . Marital status: Married    Spouse name: Not on file  . Number of children: Not on file  . Years of education: Not on file  . Highest education level: Not on file  Occupational History  . Not on file  Social Needs  . Financial resource strain: Not on file  . Food insecurity:    Worry: Not on file    Inability: Not on file  . Transportation needs:    Medical: Not on file    Non-medical: Not on file  Tobacco Use  . Smoking status: Never Smoker  Substance and Sexual Activity  . Alcohol use: Yes    Comment: occ  . Drug use: No  . Sexual activity: Not on file  Lifestyle  . Physical activity:    Days per week: Not on file    Minutes per session: Not on file  . Stress: Not on file  Relationships  . Social connections:   Talks on phone: Not on file    Gets together: Not on file    Attends religious service: Not on file    Active member of club or organization: Not on file    Attends meetings of clubs or organizations: Not on file    Relationship status: Not on file  Other Topics Concern  . Not on file  Social History Narrative  . Not on file   Allergies  Allergen Reactions  . Paroxetine     REACTION: rash   Family History  Problem Relation Age of Onset  . Hypertension Other   . Bipolar disorder Other   . Hypertension Mother        Current Outpatient Medications (Analgesics):  .  acetaminophen (TYLENOL) 500 MG tablet, Take 1,000 mg by mouth every 6 (six) hours as needed for moderate pain.   Current Outpatient Medications (Other):  Marland Kitchen.  ALPRAZolam (XANAX) 0.5 MG tablet, Take 0.5 mg by mouth at bedtime as needed for anxiety.  .  cephALEXin (KEFLEX) 500 MG capsule, Take 1 capsule (500 mg total) by mouth 3 (three) times daily. .  phenazopyridine (PYRIDIUM) 200 MG tablet, Take 1  tablet (200 mg total) by mouth 3 (three) times daily as needed for pain. Marland Kitchen  gabapentin (NEURONTIN) 100 MG capsule, Take 2 capsules (200 mg total) by mouth at bedtime.    Past medical history, social, surgical and family history all reviewed in electronic medical record.  No pertanent information unless stated regarding to the chief complaint.   Review of Systems:  No headache, visual changes, nausea, vomiting, diarrhea, constipation, dizziness, abdominal pain, skin rash, fevers, chills, night sweats, weight loss, swollen lymph nodes,chest pain, shortness of breath, mood changes.  Positive muscle aches, body aches  Objective  Blood pressure 102/82, pulse 64, height 5\' 9"  (1.753 m), SpO2 98 %.   General: No apparent distress alert and oriented x3 mood and affect normal, dressed appropriately.  HEENT: Pupils equal, extraocular movements intact  Respiratory: Patient's speak in full sentences and does not appear short of  breath  Cardiovascular: No lower extremity edema, non tender, no erythema  Skin: Warm dry intact with no signs of infection or rash on extremities or on axial skeleton.  Abdomen: Soft nontender  Neuro: Cranial nerves II through XII are intact, neurovascularly intact in all extremities with 2+ DTRs and 2+ pulses.  Lymph: No lymphadenopathy of posterior or anterior cervical chain or axillae bilaterally.  Gait normal with good balance and coordination.  MSK:  Non tender with full range of motion and good stability and symmetric strength and tone of shoulders, elbows, wrist, hip, and ankles bilaterally.  Left knee exam still has some mild tenderness over the lateral joint line but nothing severe.  Near full range of motion.  No significant instability.  Tightness of the iliotibial band.  Some more increased tenderness in the piriformis on the left side.  Negative straight leg test.  Mild pain in the sacroiliac joint on the left side.  Limited musculoskeletal ultrasound was performed and interpreted by Judi Saa  Limited ultrasound of patient's knee shows that the cystic formation that was previously seen seems to be significantly improved.  Patient still has some mild hypoechoic changes surrounding this area.  Seems to be near the fibular head as well as the sural nerve.  No significant abnormality mass or abnormal vascularity noted.   Impression and Recommendations:     This case required medical decision making of moderate complexity. The above documentation has been reviewed and is accurate and complete Judi Saa, DO       Note: This dictation was prepared with Dragon dictation along with smaller phrase technology. Any transcriptional errors that result from this process are unintentional.

## 2018-02-28 NOTE — Telephone Encounter (Signed)
Copied from CRM [id number]. Topic: Appointment Scheduling - Scheduling Inquiry for Clinic >> Feb 27, 2018  2:56 PM [name], [name] wrote: Reason for CRM: pt states she is in a lot of pain. Has been somewhat improved, but now not so much now. Pt states she was hoping to get in tomorrow, 2/4 to see Dr Katrinka BlazingSmith. Pt has appt 2/17 but states she cannot wait that long.  Pt states any time is ok. Just tell her what time

## 2018-02-28 NOTE — Telephone Encounter (Signed)
lmovm for pt to return call.  

## 2018-02-28 NOTE — Telephone Encounter (Signed)
Spoke to pt, scheduled her for 03/01/18 @ 9am.

## 2018-03-01 ENCOUNTER — Ambulatory Visit (INDEPENDENT_AMBULATORY_CARE_PROVIDER_SITE_OTHER)
Admission: RE | Admit: 2018-03-01 | Discharge: 2018-03-01 | Disposition: A | Discharge status: Home / Self Care | Payer: 59 | Source: Ambulatory Visit | Attending: Family Medicine | Admitting: Family Medicine

## 2018-03-01 ENCOUNTER — Ambulatory Visit: Payer: 59 | Admitting: Family Medicine

## 2018-03-01 ENCOUNTER — Ambulatory Visit: Payer: Self-pay

## 2018-03-01 VITALS — BP 102/82 | HR 64 | Ht 69.0 in

## 2018-03-01 DIAGNOSIS — G8929 Other chronic pain: Secondary | ICD-10-CM | POA: Diagnosis not present

## 2018-03-01 DIAGNOSIS — M25562 Pain in left knee: Secondary | ICD-10-CM | POA: Diagnosis not present

## 2018-03-01 DIAGNOSIS — M549 Dorsalgia, unspecified: Secondary | ICD-10-CM

## 2018-03-01 DIAGNOSIS — M7632 Iliotibial band syndrome, left leg: Secondary | ICD-10-CM

## 2018-03-01 MED ORDER — GABAPENTIN 100 MG PO CAPS: 200.0000 mg | ORAL_CAPSULE | Freq: Every day | ORAL | 3 refills | Status: DC

## 2018-03-01 NOTE — Patient Instructions (Signed)
Good to see you  Ice is your friend Gabapentin 200mg  at night to help if this is a nerve Xreay of the back and knee downstairs.  See if you can find out any more on the cyst from your grandfather  New exercises for the back more  See me again in 4 weeks

## 2018-03-01 NOTE — Assessment & Plan Note (Signed)
Seem to have more of a chronic iliotibial band.  Possible cystic formation of the knee also I was given an injection.  Patient attempted this and did not notice significant improvement.  Concern for more of a lumbar radiculopathy that could be contributing at this time.  X-rays ordered today.  Denies any medicine including concern for a neural sheath tumor but will need to continue to monitor it with patient.  New exercises given as well and patient work with Event organiser.  Patient will be following up with me again in 3 to 4 weeks and will consider possible advanced imaging if patient continues to have difficulty.

## 2018-03-08 ENCOUNTER — Encounter: Payer: Self-pay | Admitting: Family Medicine

## 2018-03-13 ENCOUNTER — Ambulatory Visit: Payer: 59 | Admitting: Family Medicine

## 2018-03-29 ENCOUNTER — Ambulatory Visit: Payer: 59 | Admitting: Family Medicine

## 2018-03-29 DIAGNOSIS — M7632 Iliotibial band syndrome, left leg: Secondary | ICD-10-CM

## 2018-03-29 NOTE — Patient Instructions (Signed)
Good to see you  I think you are doing well .  We will continue to increase activity as tolerated Ice after activity  See me again in 6-8 weeks if not perfect or call me sooner if worsening and may need to consider MRI Have fun in [place]!

## 2018-03-29 NOTE — Progress Notes (Signed)
[facility] [address] [place], [place] [address] Phone: [phone] Subjective:   I Ronelle Nigh am serving as a Neurosurgeon for Dr. Antoine Primas.   CC: Left knee pain follow-up  HDQ:[id number]     03/29/2018 Isabel Adams is a 63 y.o. female coming in with complaint of left knee pain. Better but still gets a painful sensation at times. There is concern for possible lumbar radiculopathy but states that the back seems to be doing well.  States that the knee seems to be doing relatively well as well.  Not having any locking but sometimes has a sharp pain more on the front of the knee and then seems to resolve after seconds.  Able to do all activities at this time.  No nighttime awakenings.     Past Medical History:  Diagnosis Date  . ANXIETY   . Chest pain   . DEPRESSION   . FATIGUE   . Glaucoma   . INSOMNIA, PERSISTENT   . Palpitations   . PVC (premature ventricular contraction)    Past Surgical History:  Procedure Laterality Date  . laproscopy    . ovarian cyst removed     Social History   Socioeconomic History  . Marital status: Married    Spouse name: Not on file  . Number of children: Not on file  . Years of education: Not on file  . Highest education level: Not on file  Occupational History  . Not on file  Social Needs  . Financial resource strain: Not on file  . Food insecurity:    Worry: Not on file    Inability: Not on file  . Transportation needs:    Medical: Not on file    Non-medical: Not on file  Tobacco Use  . Smoking status: Never Smoker  Substance and Sexual Activity  . Alcohol use: Yes    Comment: occ  . Drug use: No  . Sexual activity: Not on file  Lifestyle  . Physical activity:    Days per week: Not on file    Minutes per session: Not on file  . Stress: Not on file  Relationships  . Social connections:    Talks on phone: Not on file    Gets together: Not on file    Attends religious service: Not on  file    Active member of club or organization: Not on file    Attends meetings of clubs or organizations: Not on file    Relationship status: Not on file  Other Topics Concern  . Not on file  Social History Narrative  . Not on file   Allergies  Allergen Reactions  . Paroxetine     REACTION: rash   Family History  Problem Relation Age of Onset  . Hypertension Other   . Bipolar disorder Other   . Hypertension Mother        Current Outpatient Medications (Analgesics):  .  acetaminophen (TYLENOL) 500 MG tablet, Take 1,000 mg by mouth every 6 (six) hours as needed for moderate pain.   Current Outpatient Medications (Other):  .  gabapentin (NEURONTIN) 100 MG capsule, Take 2 capsules (200 mg total) by mouth at bedtime.    Past medical history, social, surgical and family history all reviewed in electronic medical record.  No pertanent information unless stated regarding to the chief complaint.   Review of Systems:  No headache, visual changes, nausea, vomiting, diarrhea, constipation, dizziness, abdominal pain, skin rash, fevers,  chills, night sweats, weight loss, swollen lymph nodes, body aches, joint swelling,  chest pain, shortness of breath, mood changes.  Mild positive muscle aches  Objective  Blood pressure 112/70, pulse (!) 58, height 5\' 9"  (1.753 m), SpO2 98 %. Systems examined below as of    General: No apparent distress alert and oriented x3 mood and affect normal, dressed appropriately.  HEENT: Pupils equal, extraocular movements intact  Respiratory: Patient's speak in full sentences and does not appear short of breath  Cardiovascular: No lower extremity edema, non tender, no erythema  Skin: Warm dry intact with no signs of infection or rash on extremities or on axial skeleton.  Abdomen: Soft nontender  Neuro: Cranial nerves II through XII are intact, neurovascularly intact in all extremities with 2+ DTRs and 2+ pulses.  Lymph: No lymphadenopathy of posterior or  anterior cervical chain or axillae bilaterally.  Gait normal with good balance and coordination.  MSK:  Non tender with full range of motion and good stability and symmetric strength and tone of shoulders, elbows, wrist, hip,and ankles bilaterally.  Knee: Left Normal to inspection with no erythema or effusion or obvious bony abnormalities. Pain over the lateral joint line but no true cyst appreciated ROM full in flexion and extension and lower leg rotation.  Tightness that with Pearlean Brownie test over the iliotibial band Ligaments with solid consistent endpoints including ACL, PCL, LCL, MCL. Negative Mcmurray's, Apley's, and Thessalonian tests. Mild painful patellar compression. Patellar glide mild crepitus. Patellar and quadriceps tendons unremarkable. Hamstring and quadriceps strength is normal.    Impression and Recommendations:    . The above documentation has been reviewed and is accurate and complete Judi Saa, DO       Note: This dictation was prepared with Dragon dictation along with smaller phrase technology. Any transcriptional errors that result from this process are unintentional.

## 2018-03-29 NOTE — Assessment & Plan Note (Signed)
She has had more of a chronic iliotibial band with possible intra-articular cyst noted in the knee.  We discussed the possibility of an aspiration which patient has declined.  We discussed advanced imaging including an MRI which patient has declined as well.  At this time patient's pain is not giving her any significant trouble.  Not taking the gabapentin on a regular basis.  Patient will follow-up with me as needed

## 2018-04-24 ENCOUNTER — Telehealth: Payer: Self-pay

## 2018-04-24 NOTE — Telephone Encounter (Signed)
Called patient to reschedule appointment on 05/10/2018. Left message. 

## 2018-05-02 ENCOUNTER — Telehealth: Payer: Self-pay

## 2018-05-02 NOTE — Telephone Encounter (Signed)
Called patient to reschedule appointment on 05/10/2018. Left a message.

## 2018-05-10 ENCOUNTER — Ambulatory Visit: Payer: 59 | Admitting: Family Medicine

## 2018-06-05 NOTE — Progress Notes (Deleted)
Tawana ScaleZach Zhaniya Swallows D.O. [facility] [address] [place], [place] [address] Phone: [phone] Subjective:    I'm seeing this patient by the request  of:    CC: knee pain follow up   LKG:[id number]:Subjective  Isabel Adams is a 63 y.o. female coming in with complaint of ***  Onset-  Location Duration-  Character- Aggravating factors- Reliving factors-  Therapies tried-  Severity-     Past Medical History:  Diagnosis Date  . ANXIETY   . Chest pain   . DEPRESSION   . FATIGUE   . Glaucoma   . INSOMNIA, PERSISTENT   . Palpitations   . PVC (premature ventricular contraction)    Past Surgical History:  Procedure Laterality Date  . laproscopy    . ovarian cyst removed     Social History   Socioeconomic History  . Marital status: Married    Spouse name: Not on file  . Number of children: Not on file  . Years of education: Not on file  . Highest education level: Not on file  Occupational History  . Not on file  Social Needs  . Financial resource strain: Not on file  . Food insecurity:    Worry: Not on file    Inability: Not on file  . Transportation needs:    Medical: Not on file    Non-medical: Not on file  Tobacco Use  . Smoking status: Never Smoker  Substance and Sexual Activity  . Alcohol use: Yes    Comment: occ  . Drug use: No  . Sexual activity: Not on file  Lifestyle  . Physical activity:    Days per week: Not on file    Minutes per session: Not on file  . Stress: Not on file  Relationships  . Social connections:    Talks on phone: Not on file    Gets together: Not on file    Attends religious service: Not on file    Active member of club or organization: Not on file    Attends meetings of clubs or organizations: Not on file    Relationship status: Not on file  Other Topics Concern  . Not on file  Social History Narrative  . Not on file   Allergies  Allergen Reactions  . Paroxetine     REACTION: rash   Family History  Problem  Relation Age of Onset  . Hypertension Other   . Bipolar disorder Other   . Hypertension Mother        Current Outpatient Medications (Analgesics):  .  acetaminophen (TYLENOL) 500 MG tablet, Take 1,000 mg by mouth every 6 (six) hours as needed for moderate pain.   Current Outpatient Medications (Other):  .  gabapentin (NEURONTIN) 100 MG capsule, Take 2 capsules (200 mg total) by mouth at bedtime.    Past medical history, social, surgical and family history all reviewed in electronic medical record.  No pertanent information unless stated regarding to the chief complaint.   Review of Systems:  No headache, visual changes, nausea, vomiting, diarrhea, constipation, dizziness, abdominal pain, skin rash, fevers, chills, night sweats, weight loss, swollen lymph nodes, body aches, joint swelling, muscle aches, chest pain, shortness of breath, mood changes.   Objective  There were no vitals taken for this visit. Systems examined below as of    General: No apparent distress alert and oriented x3 mood and affect normal, dressed appropriately.  HEENT: Pupils equal, extraocular movements intact  Respiratory: Patient's speak in  full sentences and does not appear short of breath  Cardiovascular: No lower extremity edema, non tender, no erythema  Skin: Warm dry intact with no signs of infection or rash on extremities or on axial skeleton.  Abdomen: Soft nontender  Neuro: Cranial nerves II through XII are intact, neurovascularly intact in all extremities with 2+ DTRs and 2+ pulses.  Lymph: No lymphadenopathy of posterior or anterior cervical chain or axillae bilaterally.  Gait normal with good balance and coordination.  MSK:  Non tender with full range of motion and good stability and symmetric strength and tone of shoulders, elbows, wrist, hip and ankles bilaterally.  Knee: Normal to inspection with no erythema or effusion or obvious bony abnormalities. Palpation normal with no warmth, joint  line tenderness, patellar tenderness, or condyle tenderness. ROM full in flexion and extension and lower leg rotation. Ligaments with solid consistent endpoints including ACL, PCL, LCL, MCL. Negative Mcmurray's, Apley's, and Thessalonian tests. Non painful patellar compression. Patellar glide without crepitus. Patellar and quadriceps tendons unremarkable. Hamstring and quadriceps strength is normal.   Impression and Recommendations:     This case required medical decision making of moderate complexity. The above documentation has been reviewed and is accurate and complete Judi Saa, DO       Note: This dictation was prepared with Dragon dictation along with smaller phrase technology. Any transcriptional errors that result from this process are unintentional.

## 2018-06-06 ENCOUNTER — Ambulatory Visit: Payer: 59 | Admitting: Family Medicine

## 2018-06-29 ENCOUNTER — Ambulatory Visit: Payer: 59 | Admitting: Family Medicine

## 2019-02-28 IMAGING — MG DIGITAL DIAGNOSTIC UNILATERAL RIGHT MAMMOGRAM WITH TOMO AND CAD
6 series · 6 of 18 positions shown · non-contrast
Comparison: Previous exam(s).

CLINICAL DATA: Patient presents complaining a tender lump in the
upper right breast.

EXAM:
DIGITAL DIAGNOSTIC RIGHT MAMMOGRAM WITH CAD AND TOMO
ULTRASOUND RIGHT BREAST

[R TAN synth-2D]
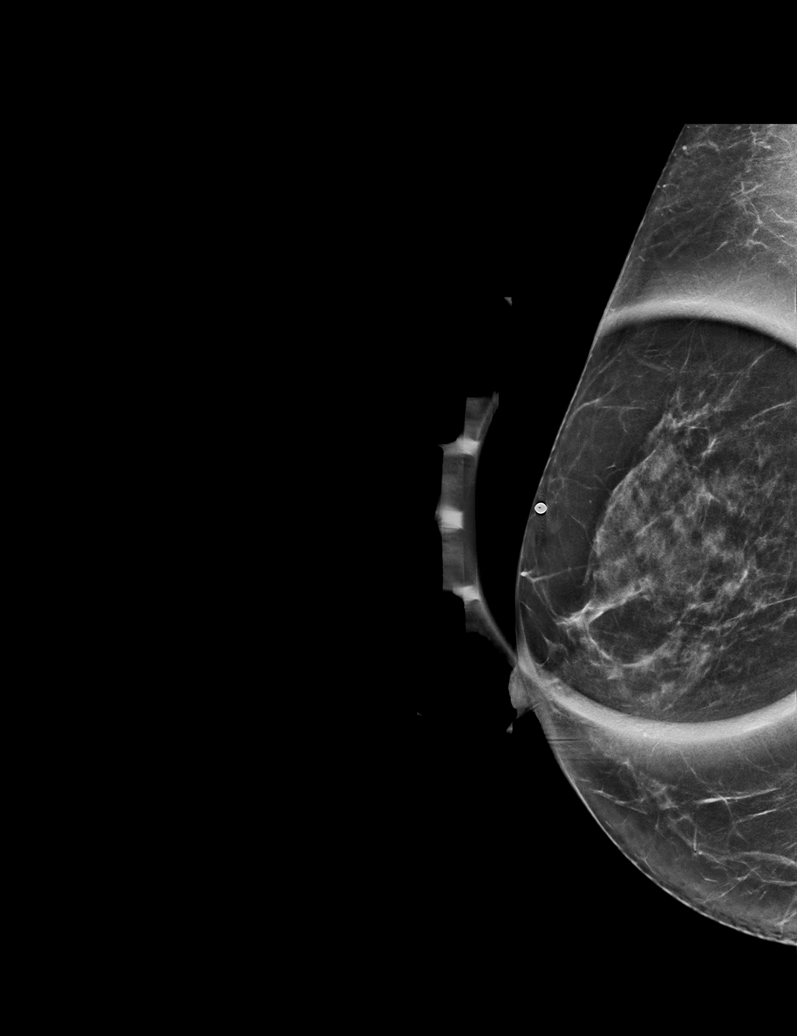

[R MLO synth-2D]
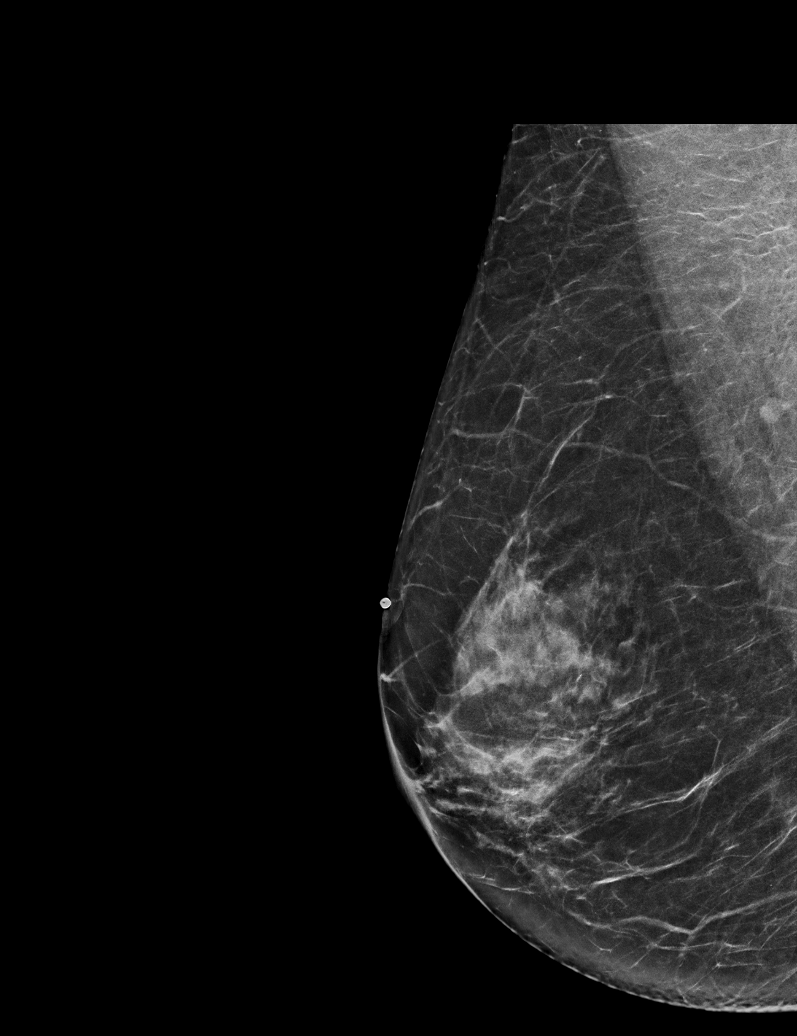

[R CC synth-2D]
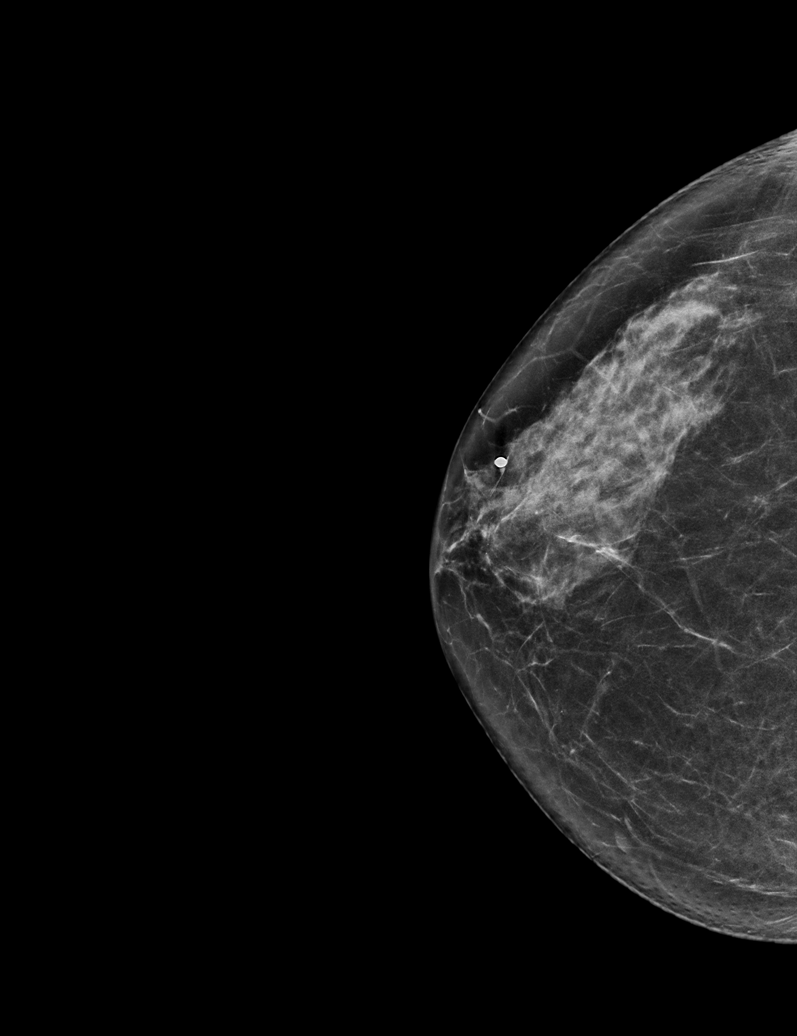

[R TAN tomo · tomo slice 29/57.0]
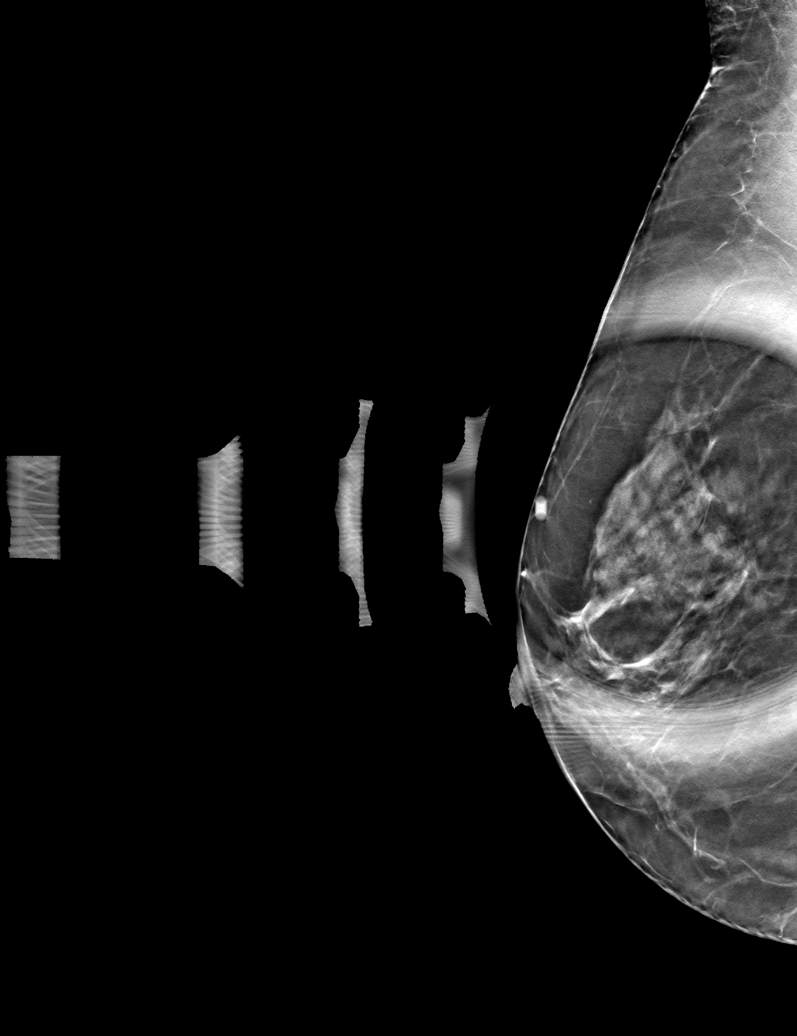

[R CC tomo · tomo slice 32/63.0]
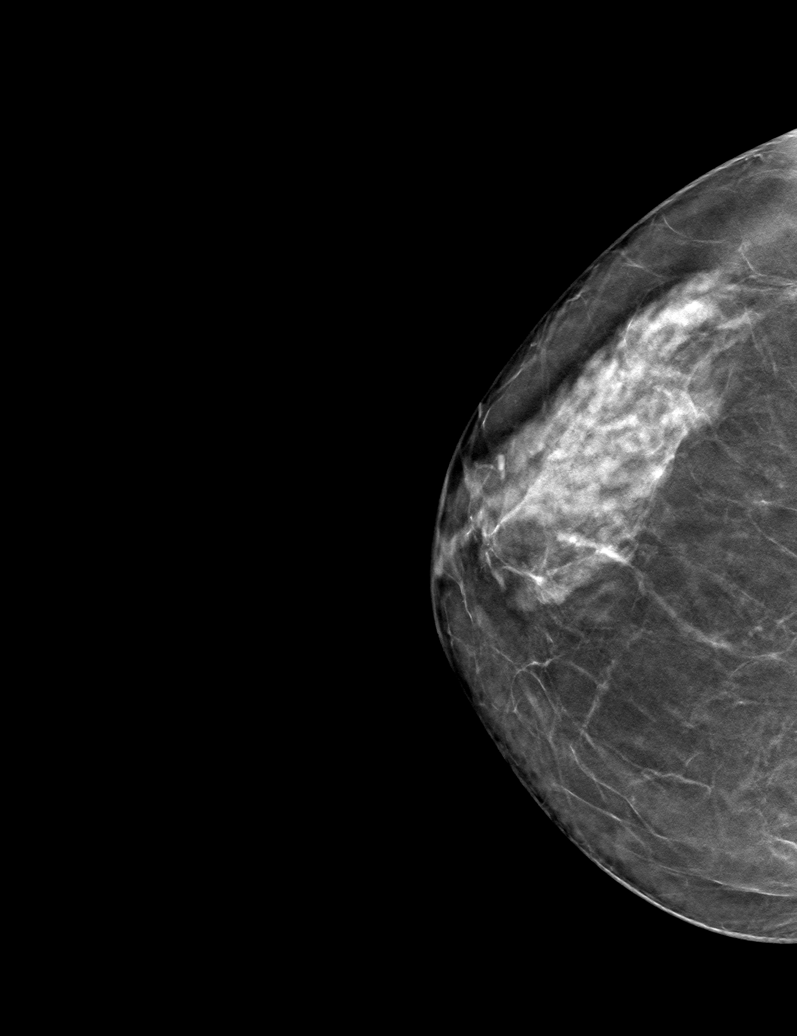

[R MLO tomo · tomo slice 33/65.0]
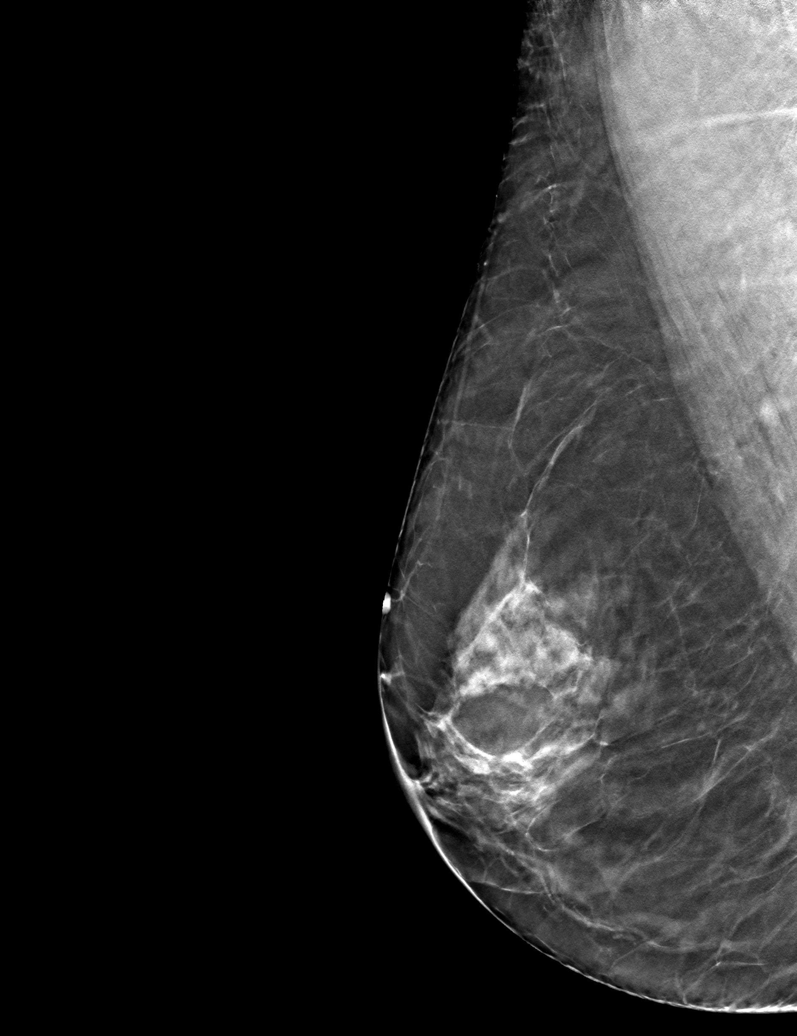

[6 of 18 positions shown; findings below may reference images not displayed]

ACR Breast Density Category c: The breast tissue is heterogeneously
dense, which may obscure small masses.
FINDINGS: There are no discrete masses, areas of architectural distortion,
areas of significant asymmetry or suspicious calcifications. No
mammographic change.

Mammographic images were processed with CAD.

On physical exam, no discrete mass is palpated in the upper outer
right breast.

Targeted ultrasound is performed, showing normal tissue throughout
the upper outer right breast. No mass, cyst or suspicious lesion.
IMPRESSION: Normal exam.  No evidence of breast malignancy.

RECOMMENDATION:
Screening mammogram in February 2018.(Code:[id number])

I have discussed the findings and recommendations with the patient.
Results were also provided in writing at the conclusion of the
visit. If applicable, a reminder letter will be sent to the patient
regarding the next appointment.

BI-RADS CATEGORY  1: Negative.

## 2019-06-05 ENCOUNTER — Encounter: Payer: Self-pay | Admitting: Obstetrics and Gynecology

## 2019-06-15 ENCOUNTER — Telehealth: Payer: Self-pay

## 2019-06-15 NOTE — Telephone Encounter (Signed)
[facility] office called to have pt established here. Called pt to get scheduled for new pt appt. LVM to return my call

## 2019-06-19 NOTE — Telephone Encounter (Signed)
Patient is scheduled with Dr. [place] for a New Patient appointment on June 11.

## 2019-07-05 ENCOUNTER — Other Ambulatory Visit: Payer: Self-pay

## 2019-07-06 ENCOUNTER — Ambulatory Visit: Payer: 59 | Admitting: Family Medicine

## 2019-07-06 ENCOUNTER — Encounter: Payer: Self-pay | Admitting: Family Medicine

## 2019-07-06 VITALS — BP 112/72 | HR 62 | Temp 97.5°F | Resp 12 | Ht 69.0 in | Wt 150.1 lb

## 2019-07-06 DIAGNOSIS — I493 Ventricular premature depolarization: Secondary | ICD-10-CM

## 2019-07-06 DIAGNOSIS — H4010X2 Unspecified open-angle glaucoma, moderate stage: Secondary | ICD-10-CM | POA: Diagnosis not present

## 2019-07-06 DIAGNOSIS — G4700 Insomnia, unspecified: Secondary | ICD-10-CM

## 2019-07-06 DIAGNOSIS — E785 Hyperlipidemia, unspecified: Secondary | ICD-10-CM

## 2019-07-06 NOTE — Patient Instructions (Signed)
A few things to remember from today's visit:   Hyperlipidemia, unspecified hyperlipidemia type  High Cholesterol  High cholesterol is a condition in which the blood has high levels of a white, waxy, fat-like substance (cholesterol). The human body needs small amounts of cholesterol. The liver makes all the cholesterol that the body needs. Extra (excess) cholesterol comes from the food that we eat. Cholesterol is carried from the liver by the blood through the blood vessels. If you have high cholesterol, deposits (plaques) may build up on the walls of your blood vessels (arteries). Plaques make the arteries narrower and stiffer. Cholesterol plaques increase your risk for heart attack and stroke. Work with your health care provider to keep your cholesterol levels in a healthy range. What increases the risk? This condition is more likely to develop in people who:  Eat foods that are high in animal fat (saturated fat) or cholesterol.  Are overweight.  Are not getting enough exercise.  Have a family history of high cholesterol. What are the signs or symptoms? There are no symptoms of this condition. How is this diagnosed? This condition may be diagnosed from the results of a blood test.  If you are older than age 63, your health care provider may check your cholesterol every 4-6 years.  You may be checked more often if you already have high cholesterol or other risk factors for heart disease. The blood test for cholesterol measures:  "Bad" cholesterol (LDL cholesterol). This is the main type of cholesterol that causes heart disease. The desired level for LDL is less than 100.  "Good" cholesterol (HDL cholesterol). This type helps to protect against heart disease by cleaning the arteries and carrying the LDL away. The desired level for HDL is 60 or higher.  Triglycerides. These are fats that the body can store or burn for energy. The desired number for triglycerides is lower than  150.  Total cholesterol. This is a measure of the total amount of cholesterol in your blood, including LDL cholesterol, HDL cholesterol, and triglycerides. A healthy number is less than 200. How is this treated? This condition is treated with diet changes, lifestyle changes, and medicines. Diet changes  This may include eating more whole grains, fruits, vegetables, nuts, and fish.  This may also include cutting back on red meat and foods that have a lot of added sugar. Lifestyle changes  Changes may include getting at least 40 minutes of aerobic exercise 3 times a week. Aerobic exercises include walking, biking, and swimming. Aerobic exercise along with a healthy diet can help you maintain a healthy weight.  Changes may also include quitting smoking. Medicines  Medicines are usually given if diet and lifestyle changes have failed to reduce your cholesterol to healthy levels.  Your health care provider may prescribe a statin medicine. Statin medicines have been shown to reduce cholesterol, which can reduce the risk of heart disease. Follow these instructions at home: Eating and drinking If told by your health care provider:  Eat chicken (without skin), fish, veal, shellfish, ground [place] breast, and round or loin cuts of red meat.  Do not eat fried foods or fatty meats, such as hot dogs and salami.  Eat plenty of fruits, such as apples.  Eat plenty of vegetables, such as broccoli, potatoes, and carrots.  Eat beans, peas, and lentils.  Eat grains such as barley, rice, couscous, and bulgur wheat.  Eat pasta without cream sauces.  Use skim or nonfat milk, and eat low-fat or nonfat yogurt  and cheeses.  Do not eat or drink whole milk, cream, ice cream, egg yolks, or hard cheeses.  Do not eat stick margarine or tub margarines that contain trans fats (also called partially hydrogenated oils).  Do not eat saturated tropical oils, such as coconut oil and palm oil.  Do not eat  cakes, cookies, crackers, or other baked goods that contain trans fats.  General instructions  Exercise as directed by your health care provider. Increase your activity level with activities such as gardening, walking, and taking the stairs.  Take over-the-counter and prescription medicines only as told by your health care provider.  Do not use any products that contain nicotine or tobacco, such as cigarettes and e-cigarettes. If you need help quitting, ask your health care provider.  Keep all follow-up visits as told by your health care provider. This is important. Contact a health care provider if:  You are struggling to maintain a healthy diet or weight.  You need help to start on an exercise program.  You need help to stop smoking. Get help right away if:  You have chest pain.  You have trouble breathing. This information is not intended to replace advice given to you by your health care provider. Make sure you discuss any questions you have with your health care provider. Document Revised: 01/14/2017 Document Reviewed: 07/12/2015 Elsevier Patient Education  [place].   Please be sure medication list is accurate. If a new problem present, please set up appointment sooner than planned today.

## 2019-07-06 NOTE — Progress Notes (Signed)
HPI:  Isabel Adams is a 64 y.o. female, who is here today to establish care.  Former PCP: N/A Last preventive routine visit: 06/04/19 with gyn.  Chronic medical problems: Open angle glaucoma, insomnia, PVC's,and HLD among some.  She was evaluated by cardiologist in 2012 because atypical CP and palpitations. She has not noted CP,unusual palpitations,orthopnea,PND,or edema. Echo in 02/2008 normal. Echo exercise stress test in 12/2010. LVEF 60%, normal study.  Glaucoma: She follows with ophthalmologist regularly, Dr Ander Slade.  Insomnia: She takes Ambien 5 to 10 mg daily, this is prescribed by her gynecologist. For the past year she has been taking Ambien 10 mg. Problem exacerbated by stress. She has tolerated medication well, no side effects.  Anxiety disorder: In the past she has been on alprazolam 0.25 mg. Last year she was under a lot of stress due to COVID 19 pandemia.  Hyperlipidemia: Currently she is on no pharmacologic treatment. She exercises regularly,walks her dog daily. Follows a healthful diet. She drinks red wine a few times per week, every other day from 6 to 12 ounces (1-2  6 ounces glasses)  She recently had blood work done at her gyn's office on 06/04/2019.  25 OH vitamin D 30.3. TSH 3.9. TC 229, TG 109, HDL 64, and LDL 146. Glucose 98. Creatinine 1.01 and EGFR 58. She has not noted decreased urine output, gross hematuria, or foamy urine.  Lab was performed around 11 AM, she had not drink any fluid at the time blood was drawn. LFTs in normal range. Hemoglobin 14.8.  Review of Systems  Constitutional: Negative for activity change, appetite change, fatigue and fever.  HENT: Negative for mouth sores, nosebleeds and sore throat.   Eyes: Negative for redness and visual disturbance.  Respiratory: Negative for cough and wheezing.   Cardiovascular: Negative for leg swelling.  Gastrointestinal: Negative for abdominal pain, nausea and vomiting.        Negative for changes in bowel habits.  Genitourinary: Negative for decreased urine volume and hematuria.  Musculoskeletal: Negative for gait problem and myalgias.  Neurological: Negative for syncope, weakness and headaches.  Rest see pertinent positives and negatives per HPI.  Current Outpatient Medications on File Prior to Visit  Medication Sig Dispense Refill  . brimonidine (ALPHAGAN) 0.2 % ophthalmic solution Place 1 drop into the right eye 2 (two) times daily.    . dorzolamidel-timolol (COSOPT PF) 22.3-6.8 MG/ML SOLN ophthalmic solution Cosopt (PF) 2 %-0.5 % eye drops in a dropperette    . estradiol (ESTRACE VAGINAL) 0.1 MG/GM vaginal cream Estrace 0.01% (0.1 mg/gram) vaginal cream  Insert 1 g by vaginal route at bedtime for 30 days.    . XELPROS 0.005 % EMUL Apply 1 drop to eye at bedtime.    Marland Kitchen zolpidem (AMBIEN) 10 MG tablet Take 10 mg by mouth at bedtime as needed.     No current facility-administered medications on file prior to visit.   Past Medical History:  Diagnosis Date  . ANXIETY   . Chest pain   . DEPRESSION   . Diverticulitis   . FATIGUE   . Glaucoma    open angle  . INSOMNIA, PERSISTENT   . Palpitations   . PVC (premature ventricular contraction)    Allergies  Allergen Reactions  . Paroxetine Rash    REACTION: rash    Family History  Problem Relation Age of Onset  . Hypertension Other   . Bipolar disorder Other   . Hypertension Mother  Social History   Socioeconomic History  . Marital status: Married    Spouse name: Not on file  . Number of children: Not on file  . Years of education: Not on file  . Highest education level: Not on file  Occupational History  . Not on file  Tobacco Use  . Smoking status: Never Smoker  . Smokeless tobacco: Never Used  Vaping Use  . Vaping Use: Never used  Substance and Sexual Activity  . Alcohol use: Yes    Comment: occ  . Drug use: No  . Sexual activity: Not on file  Other Topics Concern  . Not on file   Social History Narrative  . Not on file   Social Determinants of Health   Financial Resource Strain:   . Difficulty of Paying Living Expenses:   Food Insecurity:   . Worried About Charity fundraiser in the Last Year:   . Arboriculturist in the Last Year:   Transportation Needs:   . Film/video editor (Medical):   Marland Kitchen Lack of Transportation (Non-Medical):   Physical Activity:   . Days of Exercise per Week:   . Minutes of Exercise per Session:   Stress:   . Feeling of Stress :   Social Connections:   . Frequency of Communication with Friends and Family:   . Frequency of Social Gatherings with Friends and Family:   . Attends Religious Services:   . Active Member of Clubs or Organizations:   . Attends Archivist Meetings:   Marland Kitchen Marital Status:    Vitals:   07/06/19 1451  BP: 112/72  Pulse: 62  Resp: 12  Temp: (!) 97.5 F (36.4 C)  SpO2: 98%   Body mass index is 22.17 kg/m.  Physical Exam  Nursing note and vitals reviewed. Constitutional: She is oriented to person, place, and time. She appears well-developed. No distress.  HENT:  Head: Normocephalic and atraumatic.  Eyes: Pupils are equal, round, and reactive to light. Conjunctivae are normal.  Cardiovascular: Normal rate and regular rhythm.  No murmur heard. Pulses:      Posterior tibial pulses are 2+ on the right side and 2+ on the left side.  Respiratory: Effort normal and breath sounds normal. No respiratory distress.  GI: Soft. She exhibits no mass. There is no hepatomegaly. There is no abdominal tenderness.  Lymphadenopathy:    She has no cervical adenopathy.  Neurological: She is alert and oriented to person, place, and time. No cranial nerve deficit. Gait normal.  Skin: Skin is warm. No rash noted. No erythema.  Psychiatric:  Well groomed, good eye contact.   ASSESSMENT AND PLAN:  Isabel Adams was seen today for establish care.  Diagnoses and all orders for this visit:  INSOMNIA,  PERSISTENT Currently she is on Ambien 10 mg, recommend trying 5 mg instead (max dose for women). Good sleep hygiene. Continue following with her gynecologist.  Hyperlipidemia, unspecified hyperlipidemia type Continue low-fat diet. ASCVD 10 years risk 5.4% F/U in 05/2020.  PVC (premature ventricular contraction) No new nodules are seen. Cardiac work-up negative in 2012.  Open-angle glaucoma of both eyes, moderate stage, unspecified open-angle glaucoma type Following with ophthalmologist.   Return in about 1 year (around 07/05/2020) for cppe.   Isabel Roesler G. [place], MD  [facility]. [place] office.   A few things to remember from today's visit:   Hyperlipidemia, unspecified hyperlipidemia type  High Cholesterol  High cholesterol is a condition in which the blood  has high levels of a white, waxy, fat-like substance (cholesterol). The human body needs small amounts of cholesterol. The liver makes all the cholesterol that the body needs. Extra (excess) cholesterol comes from the food that we eat. Cholesterol is carried from the liver by the blood through the blood vessels. If you have high cholesterol, deposits (plaques) may build up on the walls of your blood vessels (arteries). Plaques make the arteries narrower and stiffer. Cholesterol plaques increase your risk for heart attack and stroke. Work with your health care provider to keep your cholesterol levels in a healthy range. What increases the risk? This condition is more likely to develop in people who:  Eat foods that are high in animal fat (saturated fat) or cholesterol.  Are overweight.  Are not getting enough exercise.  Have a family history of high cholesterol. What are the signs or symptoms? There are no symptoms of this condition. How is this diagnosed? This condition may be diagnosed from the results of a blood test.  If you are older than age 77, your health care provider may check your cholesterol  every 4-6 years.  You may be checked more often if you already have high cholesterol or other risk factors for heart disease. The blood test for cholesterol measures:  "Bad" cholesterol (LDL cholesterol). This is the main type of cholesterol that causes heart disease. The desired level for LDL is less than 100.  "Good" cholesterol (HDL cholesterol). This type helps to protect against heart disease by cleaning the arteries and carrying the LDL away. The desired level for HDL is 60 or higher.  Triglycerides. These are fats that the body can store or burn for energy. The desired number for triglycerides is lower than 150.  Total cholesterol. This is a measure of the total amount of cholesterol in your blood, including LDL cholesterol, HDL cholesterol, and triglycerides. A healthy number is less than 200. How is this treated? This condition is treated with diet changes, lifestyle changes, and medicines. Diet changes  This may include eating more whole grains, fruits, vegetables, nuts, and fish.  This may also include cutting back on red meat and foods that have a lot of added sugar. Lifestyle changes  Changes may include getting at least 40 minutes of aerobic exercise 3 times a week. Aerobic exercises include walking, biking, and swimming. Aerobic exercise along with a healthy diet can help you maintain a healthy weight.  Changes may also include quitting smoking. Medicines  Medicines are usually given if diet and lifestyle changes have failed to reduce your cholesterol to healthy levels.  Your health care provider may prescribe a statin medicine. Statin medicines have been shown to reduce cholesterol, which can reduce the risk of heart disease. Follow these instructions at home: Eating and drinking If told by your health care provider:  Eat chicken (without skin), fish, veal, shellfish, ground [place] breast, and round or loin cuts of red meat.  Do not eat fried foods or fatty meats,  such as hot dogs and salami.  Eat plenty of fruits, such as apples.  Eat plenty of vegetables, such as broccoli, potatoes, and carrots.  Eat beans, peas, and lentils.  Eat grains such as barley, rice, couscous, and bulgur wheat.  Eat pasta without cream sauces.  Use skim or nonfat milk, and eat low-fat or nonfat yogurt and cheeses.  Do not eat or drink whole milk, cream, ice cream, egg yolks, or hard cheeses.  Do not eat stick margarine or tub  margarines that contain trans fats (also called partially hydrogenated oils).  Do not eat saturated tropical oils, such as coconut oil and palm oil.  Do not eat cakes, cookies, crackers, or other baked goods that contain trans fats.  General instructions  Exercise as directed by your health care provider. Increase your activity level with activities such as gardening, walking, and taking the stairs.  Take over-the-counter and prescription medicines only as told by your health care provider.  Do not use any products that contain nicotine or tobacco, such as cigarettes and e-cigarettes. If you need help quitting, ask your health care provider.  Keep all follow-up visits as told by your health care provider. This is important. Contact a health care provider if:  You are struggling to maintain a healthy diet or weight.  You need help to start on an exercise program.  You need help to stop smoking. Get help right away if:  You have chest pain.  You have trouble breathing. This information is not intended to replace advice given to you by your health care provider. Make sure you discuss any questions you have with your health care provider. Document Revised: 01/14/2017 Document Reviewed: 07/12/2015 Elsevier Patient Education  [place].   Please be sure medication list is accurate. If a new problem present, please set up appointment sooner than planned today.

## 2019-07-24 ENCOUNTER — Ambulatory Visit: Payer: 59 | Admitting: Family Medicine

## 2019-07-24 ENCOUNTER — Encounter: Payer: Self-pay | Admitting: Family Medicine

## 2019-07-24 ENCOUNTER — Other Ambulatory Visit: Payer: Self-pay

## 2019-07-24 VITALS — BP 114/60 | HR 66 | Temp 97.6°F | Wt 148.4 lb

## 2019-07-24 DIAGNOSIS — H6123 Impacted cerumen, bilateral: Secondary | ICD-10-CM

## 2019-07-24 MED ORDER — CORTISPORIN-TC 3.3-3-10-0.5 MG/ML OT SUSP: 4.0000 [drp] | Freq: Four times a day (QID) | OTIC | 0 refills | Status: AC

## 2019-07-24 NOTE — Progress Notes (Signed)
Established Patient Office Visit  Subjective:  Patient ID: Isabel Adams, female    DOB: 11/23/1955  Age: 64 y.o. MRN: [id number]  CC:  Chief Complaint  Patient presents with  . Ear Pain    right ear feels like swimmers ear and hard to hear out of     HPI YOUNG BRIM presents for right ear pressure sensation for least a week.  Minimal discomfort.  No drainage.  She has had some change in hearing.  She does not do any swimming.  She tried some type of over-the-counter wax cleaner week ago without any improvement.  Past Medical History:  Diagnosis Date  . ANXIETY   . Chest pain   . DEPRESSION   . Diverticulitis   . FATIGUE   . Glaucoma    open angle  . INSOMNIA, PERSISTENT   . Palpitations   . PVC (premature ventricular contraction)     Past Surgical History:  Procedure Laterality Date  . laproscopy    . ovarian cyst removed      Family History  Problem Relation Age of Onset  . Hypertension Other   . Bipolar disorder Other   . Hypertension Mother     Social History   Socioeconomic History  . Marital status: Married    Spouse name: Not on file  . Number of children: Not on file  . Years of education: Not on file  . Highest education level: Not on file  Occupational History  . Not on file  Tobacco Use  . Smoking status: Never Smoker  . Smokeless tobacco: Never Used  Vaping Use  . Vaping Use: Never used  Substance and Sexual Activity  . Alcohol use: Yes    Comment: occ  . Drug use: No  . Sexual activity: Not on file  Other Topics Concern  . Not on file  Social History Narrative  . Not on file   Social Determinants of Health   Financial Resource Strain:   . Difficulty of Paying Living Expenses:   Food Insecurity:   . Worried About Programme researcher, broadcasting/film/video in the Last Year:   . Barista in the Last Year:   Transportation Needs:   . Freight forwarder (Medical):   Marland Kitchen Lack of Transportation (Non-Medical):   Physical Activity:   . Days  of Exercise per Week:   . Minutes of Exercise per Session:   Stress:   . Feeling of Stress :   Social Connections:   . Frequency of Communication with Friends and Family:   . Frequency of Social Gatherings with Friends and Family:   . Attends Religious Services:   . Active Member of Clubs or Organizations:   . Attends Banker Meetings:   Marland Kitchen Marital Status:   Intimate Partner Violence:   . Fear of Current or Ex-Partner:   . Emotionally Abused:   Marland Kitchen Physically Abused:   . Sexually Abused:     Outpatient Medications Prior to Visit  Medication Sig Dispense Refill  . brimonidine (ALPHAGAN) 0.2 % ophthalmic solution Place 1 drop into the right eye 2 (two) times daily.    . dorzolamidel-timolol (COSOPT PF) 22.3-6.8 MG/ML SOLN ophthalmic solution Cosopt (PF) 2 %-0.5 % eye drops in a dropperette    . estradiol (ESTRACE VAGINAL) 0.1 MG/GM vaginal cream Estrace 0.01% (0.1 mg/gram) vaginal cream  Insert 1 g by vaginal route at bedtime for 30 days.    . XELPROS 0.005 % EMUL Apply  1 drop to eye at bedtime.    Marland Kitchen zolpidem (AMBIEN) 10 MG tablet Take 10 mg by mouth at bedtime as needed.     No facility-administered medications prior to visit.    Allergies  Allergen Reactions  . Paroxetine Rash    REACTION: rash    ROS Review of Systems  Constitutional: Negative for chills and fever.  HENT: Positive for hearing loss. Negative for ear discharge.       Objective:    Physical Exam Vitals reviewed.  Constitutional:      Appearance: Normal appearance.  HENT:     Ears:     Comments: Bilateral cerumen impaction Neurological:     Mental Status: She is alert.     BP 114/60 (BP Location: Left Arm, Patient Position: Sitting, Cuff Size: Normal)   Pulse 66   Temp 97.6 F (36.4 C) (Temporal)   Wt 148 lb 6.4 oz (67.3 kg)   SpO2 99%   BMI 21.91 kg/m  Wt Readings from Last 3 Encounters:  07/24/19 148 lb 6.4 oz (67.3 kg)  07/06/19 150 lb 2 oz (68.1 kg)  02/03/18 153 lb (69.4  kg)     Health Maintenance Due  Topic Date Due  . Hepatitis C Screening  Never done  . TETANUS/TDAP  Never done  . COLONOSCOPY  Never done  . MAMMOGRAM  11/09/2014  . DEXA SCAN  Never done  . PNA vac Low Risk Adult (1 of 2 - PCV13) Never done    There are no preventive care reminders to display for this patient.  Lab Results  Component Value Date   TSH 2.26 02/03/2018   Lab Results  Component Value Date   WBC 5.0 02/03/2018   HGB 13.7 02/03/2018   HCT 41.2 02/03/2018   MCV 95.1 02/03/2018   PLT 235.0 02/03/2018   Lab Results  Component Value Date   NA 140 02/03/2018   K 4.6 02/03/2018   CO2 22 02/03/2018   GLUCOSE 106 (H) 02/03/2018   BUN 15 02/03/2018   CREATININE 0.74 02/03/2018   BILITOT 0.4 02/03/2018   ALKPHOS 67 02/03/2018   AST 16 02/03/2018   ALT 15 02/03/2018   PROT 6.6 02/03/2018   ALBUMIN 4.4 02/03/2018   CALCIUM 9.5 02/03/2018   CALCIUM 9.4 02/03/2018   GFR 83.80 02/03/2018   No results found for: CHOL No results found for: HDL No results found for: LDLCALC No results found for: TRIG No results found for: CHOLHDL No results found for: PFXT0W    Assessment & Plan:   Bilateral cerumen impactions  -We recommended irrigation.  We discussed risk including pain, low risk of bleeding, and low risk of eardrum perforation.  Patient consented. -Right canal was cleared of most of cerumen there is still some residual cerumen deep in the canal that cannot be cleared after nurse irrigation.  She does have some mild erythema of the right ear canal.  Left canal is fully cleared with combination irrigation and used some flat teeth forceps  No orders of the defined types were placed in this encounter.   Follow-up: No follow-ups on file.    Evelena Peat, MD

## 2019-07-24 NOTE — Patient Instructions (Signed)
Earwax Buildup, Adult The ears produce a substance called earwax that helps keep bacteria out of the ear and protects the skin in the ear canal. Occasionally, earwax can build up in the ear and cause discomfort or hearing loss. What increases the risk? This condition is more likely to develop in people who:  Are female.  Are elderly.  Naturally produce more earwax.  Clean their ears often with cotton swabs.  Use earplugs often.  Use in-ear headphones often.  Wear hearing aids.  Have narrow ear canals.  Have earwax that is overly thick or sticky.  Have eczema.  Are dehydrated.  Have excess hair in the ear canal. What are the signs or symptoms? Symptoms of this condition include:  Reduced or muffled hearing.  A feeling of fullness in the ear or feeling that the ear is plugged.  Fluid coming from the ear.  Ear pain.  Ear itch.  Ringing in the ear.  Coughing.  An obvious piece of earwax that can be seen inside the ear canal. How is this diagnosed? This condition may be diagnosed based on:  Your symptoms.  Your medical history.  An ear exam. During the exam, your health care provider will look into your ear with an instrument called an otoscope. You may have tests, including a hearing test. How is this treated? This condition may be treated by:  Using ear drops to soften the earwax.  Having the earwax removed by a health care provider. The health care provider may: ? Flush the ear with water. ? Use an instrument that has a loop on the end (curette). ? Use a suction device.  Surgery to remove the wax buildup. This may be done in severe cases. Follow these instructions at home:   Take over-the-counter and prescription medicines only as told by your health care provider.  Do not put any objects, including cotton swabs, into your ear. You can clean the opening of your ear canal with a washcloth or facial tissue.  Follow instructions from your health care  provider about cleaning your ears. Do not over-clean your ears.  Drink enough fluid to keep your urine clear or pale yellow. This will help to thin the earwax.  Keep all follow-up visits as told by your health care provider. If earwax builds up in your ears often or if you use hearing aids, consider seeing your health care provider for routine, preventive ear cleanings. Ask your health care provider how often you should schedule your cleanings.  If you have hearing aids, clean them according to instructions from the manufacturer and your health care provider. Contact a health care provider if:  You have ear pain.  You develop a fever.  You have blood, pus, or other fluid coming from your ear.  You have hearing loss.  You have ringing in your ears that does not go away.  Your symptoms do not improve with treatment.  You feel like the room is spinning (vertigo). Summary  Earwax can build up in the ear and cause discomfort or hearing loss.  The most common symptoms of this condition include reduced or muffled hearing and a feeling of fullness in the ear or feeling that the ear is plugged.  This condition may be diagnosed based on your symptoms, your medical history, and an ear exam.  This condition may be treated by using ear drops to soften the earwax or by having the earwax removed by a health care provider.  Do not put any   objects, including cotton swabs, into your ear. You can clean the opening of your ear canal with a washcloth or facial tissue. This information is not intended to replace advice given to you by your health care provider. Make sure you discuss any questions you have with your health care provider. Document Revised: 12/24/2016 Document Reviewed: 03/24/2016 Elsevier Patient Education  2020 Elsevier Inc.  

## 2020-02-06 DIAGNOSIS — H401122 Primary open-angle glaucoma, left eye, moderate stage: Secondary | ICD-10-CM | POA: Diagnosis not present

## 2020-02-06 DIAGNOSIS — H2513 Age-related nuclear cataract, bilateral: Secondary | ICD-10-CM | POA: Diagnosis not present

## 2020-02-06 DIAGNOSIS — H401113 Primary open-angle glaucoma, right eye, severe stage: Secondary | ICD-10-CM | POA: Diagnosis not present

## 2020-03-24 DIAGNOSIS — Z13820 Encounter for screening for osteoporosis: Secondary | ICD-10-CM | POA: Diagnosis not present

## 2020-04-03 DIAGNOSIS — M810 Age-related osteoporosis without current pathological fracture: Secondary | ICD-10-CM | POA: Diagnosis not present

## 2020-04-23 DIAGNOSIS — H401113 Primary open-angle glaucoma, right eye, severe stage: Secondary | ICD-10-CM | POA: Diagnosis not present

## 2020-04-23 DIAGNOSIS — H401122 Primary open-angle glaucoma, left eye, moderate stage: Secondary | ICD-10-CM | POA: Diagnosis not present

## 2020-04-28 DIAGNOSIS — H401122 Primary open-angle glaucoma, left eye, moderate stage: Secondary | ICD-10-CM | POA: Diagnosis not present

## 2020-04-28 DIAGNOSIS — H401113 Primary open-angle glaucoma, right eye, severe stage: Secondary | ICD-10-CM | POA: Diagnosis not present

## 2020-04-28 DIAGNOSIS — H2513 Age-related nuclear cataract, bilateral: Secondary | ICD-10-CM | POA: Diagnosis not present

## 2020-05-06 DIAGNOSIS — M9903 Segmental and somatic dysfunction of lumbar region: Secondary | ICD-10-CM | POA: Diagnosis not present

## 2020-05-06 DIAGNOSIS — M5413 Radiculopathy, cervicothoracic region: Secondary | ICD-10-CM | POA: Diagnosis not present

## 2020-05-06 DIAGNOSIS — M5451 Vertebrogenic low back pain: Secondary | ICD-10-CM | POA: Diagnosis not present

## 2020-05-06 DIAGNOSIS — M9901 Segmental and somatic dysfunction of cervical region: Secondary | ICD-10-CM | POA: Diagnosis not present

## 2020-05-16 DIAGNOSIS — M545 Low back pain, unspecified: Secondary | ICD-10-CM | POA: Diagnosis not present

## 2020-05-24 DIAGNOSIS — M545 Low back pain, unspecified: Secondary | ICD-10-CM | POA: Diagnosis not present

## 2020-05-27 DIAGNOSIS — M48062 Spinal stenosis, lumbar region with neurogenic claudication: Secondary | ICD-10-CM | POA: Diagnosis not present

## 2020-06-02 DIAGNOSIS — M48061 Spinal stenosis, lumbar region without neurogenic claudication: Secondary | ICD-10-CM | POA: Diagnosis not present

## 2020-06-11 DIAGNOSIS — Z6821 Body mass index (BMI) 21.0-21.9, adult: Secondary | ICD-10-CM | POA: Diagnosis not present

## 2020-06-11 DIAGNOSIS — Z1231 Encounter for screening mammogram for malignant neoplasm of breast: Secondary | ICD-10-CM | POA: Diagnosis not present

## 2020-06-11 DIAGNOSIS — Z01419 Encounter for gynecological examination (general) (routine) without abnormal findings: Secondary | ICD-10-CM | POA: Diagnosis not present

## 2020-06-11 DIAGNOSIS — Z23 Encounter for immunization: Secondary | ICD-10-CM | POA: Diagnosis not present

## 2020-06-11 DIAGNOSIS — M818 Other osteoporosis without current pathological fracture: Secondary | ICD-10-CM | POA: Diagnosis not present

## 2020-06-24 DIAGNOSIS — M48062 Spinal stenosis, lumbar region with neurogenic claudication: Secondary | ICD-10-CM | POA: Diagnosis not present

## 2020-06-25 ENCOUNTER — Ambulatory Visit (INDEPENDENT_AMBULATORY_CARE_PROVIDER_SITE_OTHER): Payer: BC Managed Care – PPO | Admitting: Adult Health

## 2020-06-25 ENCOUNTER — Encounter: Payer: Self-pay | Admitting: Adult Health

## 2020-06-25 ENCOUNTER — Encounter: Payer: Self-pay | Admitting: Family Medicine

## 2020-06-25 ENCOUNTER — Other Ambulatory Visit: Payer: Self-pay

## 2020-06-25 VITALS — BP 108/70 | HR 77 | Temp 97.9°F | Ht 69.0 in | Wt 146.0 lb

## 2020-06-25 DIAGNOSIS — S30861A Insect bite (nonvenomous) of abdominal wall, initial encounter: Secondary | ICD-10-CM

## 2020-06-25 DIAGNOSIS — W57XXXA Bitten or stung by nonvenomous insect and other nonvenomous arthropods, initial encounter: Secondary | ICD-10-CM | POA: Diagnosis not present

## 2020-06-25 NOTE — Progress Notes (Signed)
Subjective:    Patient ID: Isabel Adams, female    DOB: 1955-10-03, 65 y.o.   MRN: [id number]  HPI  65 year old female who  has a past medical history of ANXIETY, Chest pain, DEPRESSION, Diverticulitis, FATIGUE, Glaucoma, INSOMNIA, PERSISTENT, Palpitations, and PVC (premature ventricular contraction).  She presents to the office today for concern of tick bite. She noticed a tick attached to her three days ago. Believes tick was attached less than a day. Has a small red itchy bump at site of bite. Denies any other symptoms   Tick was easily removed.   Review of Systems See HPI   Past Medical History:  Diagnosis Date  . ANXIETY   . Chest pain   . DEPRESSION   . Diverticulitis   . FATIGUE   . Glaucoma    open angle  . INSOMNIA, PERSISTENT   . Palpitations   . PVC (premature ventricular contraction)     Social History   Socioeconomic History  . Marital status: Married    Spouse name: Not on file  . Number of children: Not on file  . Years of education: Not on file  . Highest education level: Not on file  Occupational History  . Not on file  Tobacco Use  . Smoking status: Never Smoker  . Smokeless tobacco: Never Used  Vaping Use  . Vaping Use: Never used  Substance and Sexual Activity  . Alcohol use: Yes    Comment: occ  . Drug use: No  . Sexual activity: Not on file  Other Topics Concern  . Not on file  Social History Narrative  . Not on file   Social Determinants of Health   Financial Resource Strain: Not on file  Food Insecurity: Not on file  Transportation Needs: Not on file  Physical Activity: Not on file  Stress: Not on file  Social Connections: Not on file  Intimate Partner Violence: Not on file    Past Surgical History:  Procedure Laterality Date  . laproscopy    . ovarian cyst removed      Family History  Problem Relation Age of Onset  . Hypertension Other   . Bipolar disorder Other   . Hypertension Mother     Allergies  Allergen  Reactions  . Paroxetine Rash    REACTION: rash    Current Outpatient Medications on File Prior to Visit  Medication Sig Dispense Refill  . alendronate (FOSAMAX) 70 MG tablet alendronate 70 mg tablet  TAKE 1 TABLET BY MOUTH ONCE A WEEK    . brimonidine (ALPHAGAN) 0.2 % ophthalmic solution Place 1 drop into the right eye 2 (two) times daily.    . dorzolamidel-timolol (COSOPT PF) 22.3-6.8 MG/ML SOLN ophthalmic solution Cosopt (PF) 2 %-0.5 % eye drops in a dropperette    . estradiol (ESTRACE) 0.1 MG/GM vaginal cream Estrace 0.01% (0.1 mg/gram) vaginal cream  Insert 1 g by vaginal route at bedtime for 30 days.    Marland Kitchen neomycin-colistin-hydrocortisone-thonzonium (CORTISPORIN-TC) 3.03-27-08-0.5 MG/ML OTIC suspension Place 4 drops into both ears 4 (four) times daily. 10 mL 0  . XELPROS 0.005 % EMUL Apply 1 drop to eye at bedtime.    Marland Kitchen zolpidem (AMBIEN) 10 MG tablet Take 10 mg by mouth at bedtime as needed.     No current facility-administered medications on file prior to visit.    BP 108/70   Pulse 77   Temp 97.9 F (36.6 C) (Oral)   Ht 5\' 9"  (1.753 m)  Wt 146 lb (66.2 kg)   SpO2 96%   BMI 21.56 kg/m       Objective:   Physical Exam Vitals and nursing note reviewed.  Constitutional:      Appearance: Normal appearance.  Skin:    General: Skin is warm and dry.     Capillary Refill: Capillary refill takes less than 2 seconds.     Findings: Erythema present.     Comments: Small area of localized erythema noted on left hip at site of tick bite. No bullseye sign noted.     Neurological:     General: No focal deficit present.     Mental Status: She is alert and oriented to person, place, and time.       Assessment & Plan:  1. Insect bite (nonvenomous) of abdominal wall, initial encounter - tick attached less than 24 hours.  - No indication for treatment at this time  - Watchful waiting advised - Follow up as needed  Shirline Frees, NP

## 2020-07-23 DIAGNOSIS — R739 Hyperglycemia, unspecified: Secondary | ICD-10-CM | POA: Diagnosis not present

## 2020-08-08 DIAGNOSIS — M533 Sacrococcygeal disorders, not elsewhere classified: Secondary | ICD-10-CM | POA: Diagnosis not present

## 2020-08-08 DIAGNOSIS — M48061 Spinal stenosis, lumbar region without neurogenic claudication: Secondary | ICD-10-CM | POA: Diagnosis not present

## 2020-09-03 DIAGNOSIS — H401122 Primary open-angle glaucoma, left eye, moderate stage: Secondary | ICD-10-CM | POA: Diagnosis not present

## 2020-09-03 DIAGNOSIS — H401113 Primary open-angle glaucoma, right eye, severe stage: Secondary | ICD-10-CM | POA: Diagnosis not present

## 2020-10-10 DIAGNOSIS — R309 Painful micturition, unspecified: Secondary | ICD-10-CM | POA: Diagnosis not present

## 2020-12-05 DIAGNOSIS — Z808 Family history of malignant neoplasm of other organs or systems: Secondary | ICD-10-CM | POA: Diagnosis not present

## 2020-12-05 DIAGNOSIS — D2272 Melanocytic nevi of left lower limb, including hip: Secondary | ICD-10-CM | POA: Diagnosis not present

## 2020-12-05 DIAGNOSIS — L7211 Pilar cyst: Secondary | ICD-10-CM | POA: Diagnosis not present

## 2020-12-05 DIAGNOSIS — D225 Melanocytic nevi of trunk: Secondary | ICD-10-CM | POA: Diagnosis not present

## 2020-12-08 DIAGNOSIS — N3000 Acute cystitis without hematuria: Secondary | ICD-10-CM | POA: Diagnosis not present

## 2020-12-08 DIAGNOSIS — N952 Postmenopausal atrophic vaginitis: Secondary | ICD-10-CM | POA: Diagnosis not present

## 2020-12-09 DIAGNOSIS — M810 Age-related osteoporosis without current pathological fracture: Secondary | ICD-10-CM | POA: Diagnosis not present

## 2020-12-10 DIAGNOSIS — Z1211 Encounter for screening for malignant neoplasm of colon: Secondary | ICD-10-CM | POA: Diagnosis not present

## 2021-01-09 DIAGNOSIS — H401113 Primary open-angle glaucoma, right eye, severe stage: Secondary | ICD-10-CM | POA: Diagnosis not present

## 2021-01-09 DIAGNOSIS — H401122 Primary open-angle glaucoma, left eye, moderate stage: Secondary | ICD-10-CM | POA: Diagnosis not present

## 2021-01-09 DIAGNOSIS — H2513 Age-related nuclear cataract, bilateral: Secondary | ICD-10-CM | POA: Diagnosis not present

## 2021-02-16 DIAGNOSIS — E041 Nontoxic single thyroid nodule: Secondary | ICD-10-CM | POA: Diagnosis not present

## 2021-02-16 DIAGNOSIS — R9389 Abnormal findings on diagnostic imaging of other specified body structures: Secondary | ICD-10-CM | POA: Diagnosis not present

## 2021-02-18 ENCOUNTER — Other Ambulatory Visit: Payer: Self-pay | Admitting: Obstetrics and Gynecology

## 2021-02-18 DIAGNOSIS — E041 Nontoxic single thyroid nodule: Secondary | ICD-10-CM

## 2021-02-23 ENCOUNTER — Ambulatory Visit
Admission: RE | Admit: 2021-02-23 | Discharge: 2021-02-23 | Disposition: A | Discharge status: Home / Self Care | Payer: BC Managed Care – PPO | Source: Ambulatory Visit | Attending: Obstetrics and Gynecology | Admitting: Obstetrics and Gynecology

## 2021-02-23 DIAGNOSIS — E042 Nontoxic multinodular goiter: Secondary | ICD-10-CM | POA: Diagnosis not present

## 2021-02-23 DIAGNOSIS — E041 Nontoxic single thyroid nodule: Secondary | ICD-10-CM

## 2021-03-05 ENCOUNTER — Ambulatory Visit: Payer: BC Managed Care – PPO | Admitting: Cardiology

## 2021-03-09 DIAGNOSIS — E042 Nontoxic multinodular goiter: Secondary | ICD-10-CM | POA: Diagnosis not present

## 2021-03-09 DIAGNOSIS — Z833 Family history of diabetes mellitus: Secondary | ICD-10-CM | POA: Diagnosis not present

## 2021-03-09 DIAGNOSIS — R7303 Prediabetes: Secondary | ICD-10-CM | POA: Diagnosis not present

## 2021-03-09 DIAGNOSIS — M810 Age-related osteoporosis without current pathological fracture: Secondary | ICD-10-CM | POA: Diagnosis not present

## 2021-03-13 ENCOUNTER — Ambulatory Visit: Payer: BC Managed Care – PPO | Attending: Internal Medicine

## 2021-03-13 ENCOUNTER — Other Ambulatory Visit: Payer: Self-pay

## 2021-03-13 DIAGNOSIS — R29898 Other symptoms and signs involving the musculoskeletal system: Secondary | ICD-10-CM | POA: Diagnosis not present

## 2021-03-13 NOTE — Therapy (Signed)
[facility] [address] [place], [place], [address] Phone: [phone]   Fax:  [phone]  Physical Therapy Evaluation  Patient Details  Name: Isabel Adams MRN: [id number] Date of Birth: 1955-03-27 Referring Provider (PT): Talmage Coin, MD   Encounter Date: 03/13/2021   PT End of Session - 03/13/21 0942     Visit Number 1    Number of Visits 1    Authorization Type BCBS    Authorization - Visit Number 1    Authorization - Number of Visits 30    PT Start Time 0940    PT Stop Time 1015    PT Time Calculation (min) 35 min    Activity Tolerance Patient tolerated treatment well    Behavior During Therapy [facility] for tasks assessed/performed             Past Medical History:  Diagnosis Date   ANXIETY    Chest pain    DEPRESSION    Diverticulitis    FATIGUE    Glaucoma    open angle   INSOMNIA, PERSISTENT    Palpitations    PVC (premature ventricular contraction)     Past Surgical History:  Procedure Laterality Date   laproscopy     ovarian cyst removed      There were no vitals filed for this visit.    Subjective Assessment - 03/13/21 0945     Subjective Age related osteoporosis with T-score -2.7.  Unfortunately had adverse effects from the relevant medications and discontinued use. No pathologic fracture has occurred.    Currently in Pain? No/denies                [facility] PT Assessment - 03/13/21 0001       Assessment   Medical Diagnosis Age-related osteoporosis without current pathological fracture    Referring Provider (PT) Talmage Coin, MD      Balance Screen   Has the patient fallen in the past 6 months No    How many times? 0    Has the patient had a decrease in activity level because of a fear of falling?  No    Is the patient reluctant to leave their home because of a fear of falling?  No      Home Tourist information centre manager residence      Prior Function   Level of  Independence Independent    Vocation Retired    Leisure active lifestyle, Gaffer;Hopping;Single leg stance;Floor to Stand      Squat   Comments good form      Hopping   Comments WNL, double limb takeoff      Single Leg Stance   Comments 20 sec      Floor to Stand   Comments independent      Posture/Postural Control   Posture/Postural Control No significant limitations      ROM / Strength   AROM / PROM / Strength AROM;Strength      AROM   Overall AROM  Within functional limits for tasks performed      Strength   Overall Strength Within functional limits for tasks performed      Transfers   Five time sit to stand comments  7 seconds      Ambulation/Gait   Gait velocity WNL      Standardized Balance Assessment   Standardized Balance Assessment  Timed Up and Go Test      Timed Up and Go Test   Normal TUG (seconds) 7                        Objective measurements completed on examination: See above findings.                PT Education - 03/13/21 1121     Education Details pt education on relevant programming and physical training that is safe and effective for addressing osteoporosis and benefits of supervised vs unsupervised PRE for loading    Person(s) Educated Patient    Methods Explanation;Handout    Comprehension Verbalized understanding;Returned demonstration              PT Short Term Goals - 03/13/21 1128       PT SHORT TERM GOAL #1   Title Return demonstration of understanding of exercise intervention specific to osteoporosis    Time 1    Period Days    Status Achieved    Target Date 03/13/21                       Plan - 03/13/21 1123     Clinical Impression Statement Pt is 66 yo lady with age-related osteoporosis who presents for evaluation.  Discussion with pt regarding scope and benefit of PT as it pertains to programming and intervention for  osteoporosis. Patient reports understanding of disease process and reports active lifestyle. This therapist counciled on benefits of LIFTMOR study demonstrating safety and efficacy of high-intensity interval training in regards to bone density benefits.  Patient feels comfortable and confident following discussion that she will be able to manage a program on her own with community resources and implementing her own program at home. Exam reveals LE strength WNL, normal dynamic balance, and low risk for falls.    Personal Factors and Comorbidities Comorbidity 1    Comorbidities osteoporosis    Stability/Clinical Decision Making Stable/Uncomplicated    Clinical Decision Making Low    Rehab Potential Excellent    PT Frequency One time visit    PT Treatment/Interventions ADLs/Self Care Home Management;Patient/family education    PT Home Exercise Plan LIFTMOR protocol referenced. On-line resources regarding exercise intervention and osteoporosis    Consulted and Agree with Plan of Care Patient             Patient will benefit from skilled therapeutic intervention in order to improve the following deficits and impairments:  Other (comment) (education regarding physiology of PRE intervention)  Visit Diagnosis: Other symptoms and signs involving the musculoskeletal system     Problem List Patient Active Problem List   Diagnosis Date Noted   Hyperlipidemia 07/06/2019   Bilateral open angle glaucoma, moderate stage 07/06/2019   It band syndrome, left 12/20/2017   Chest pain 01/07/2011   PVC (premature ventricular contraction) 02/27/2008   ANXIETY 07/03/2007   INSOMNIA, PERSISTENT 07/03/2007   DEPRESSION 07/03/2007   FATIGUE 07/03/2007    Dion Body, PT 03/13/2021, 11:32 AM  [facility] [address], [address] [place], [place], [address] Phone: [phone]   Fax:  [phone]  Name: Isabel Adams MRN: [id number] Date of Birth:  1955/07/21

## 2021-04-13 DIAGNOSIS — H25813 Combined forms of age-related cataract, bilateral: Secondary | ICD-10-CM | POA: Diagnosis not present

## 2021-04-13 DIAGNOSIS — H401113 Primary open-angle glaucoma, right eye, severe stage: Secondary | ICD-10-CM | POA: Diagnosis not present

## 2021-04-13 DIAGNOSIS — H401122 Primary open-angle glaucoma, left eye, moderate stage: Secondary | ICD-10-CM | POA: Diagnosis not present

## 2021-04-14 DIAGNOSIS — H401131 Primary open-angle glaucoma, bilateral, mild stage: Secondary | ICD-10-CM | POA: Diagnosis not present

## 2021-04-16 ENCOUNTER — Telehealth: Payer: Self-pay

## 2021-04-16 NOTE — Telephone Encounter (Signed)
Last OV with PCP 07/06/19. When pt notified of this, she stated that she usually sees her GYN for her annual exam & has an appt scheduled for May 2023. Pt notified that records will be needed & given permission to request records from GI & GYN. Pt also notified that in order to remain a pt of Dr Elvis Coil, she typically would need to be seen in 3 yrs, but that this call will be reviewed by Dr [place] to determine if that's ok. Attempted to close some HM gaps. ?

## 2021-04-22 NOTE — Telephone Encounter (Signed)
I do not see a problem with continue annual follow ups with her gyn. Please remind her that is I see her every 3 years, it will be considered as a new pt every time, so she may want to have a visit right before 3 years. ?Thanks, ?BJ ?

## 2021-06-12 DIAGNOSIS — Z124 Encounter for screening for malignant neoplasm of cervix: Secondary | ICD-10-CM | POA: Diagnosis not present

## 2021-06-12 DIAGNOSIS — R8279 Other abnormal findings on microbiological examination of urine: Secondary | ICD-10-CM | POA: Diagnosis not present

## 2021-06-12 DIAGNOSIS — Z1231 Encounter for screening mammogram for malignant neoplasm of breast: Secondary | ICD-10-CM | POA: Diagnosis not present

## 2021-06-12 DIAGNOSIS — Z01419 Encounter for gynecological examination (general) (routine) without abnormal findings: Secondary | ICD-10-CM | POA: Diagnosis not present

## 2021-06-12 DIAGNOSIS — Z1151 Encounter for screening for human papillomavirus (HPV): Secondary | ICD-10-CM | POA: Diagnosis not present

## 2021-06-12 DIAGNOSIS — Z681 Body mass index (BMI) 19 or less, adult: Secondary | ICD-10-CM | POA: Diagnosis not present

## 2021-06-15 ENCOUNTER — Other Ambulatory Visit: Payer: Self-pay | Admitting: Obstetrics and Gynecology

## 2021-06-15 DIAGNOSIS — R928 Other abnormal and inconclusive findings on diagnostic imaging of breast: Secondary | ICD-10-CM

## 2021-06-24 ENCOUNTER — Ambulatory Visit
Admission: RE | Admit: 2021-06-24 | Discharge: 2021-06-24 | Disposition: A | Discharge status: Home / Self Care | Payer: BC Managed Care – PPO | Source: Ambulatory Visit | Attending: Obstetrics and Gynecology | Admitting: Obstetrics and Gynecology

## 2021-06-24 ENCOUNTER — Ambulatory Visit: Payer: BC Managed Care – PPO

## 2021-06-24 DIAGNOSIS — R922 Inconclusive mammogram: Secondary | ICD-10-CM | POA: Diagnosis not present

## 2021-06-24 DIAGNOSIS — R928 Other abnormal and inconclusive findings on diagnostic imaging of breast: Secondary | ICD-10-CM

## 2021-07-10 DIAGNOSIS — Z13228 Encounter for screening for other metabolic disorders: Secondary | ICD-10-CM | POA: Diagnosis not present

## 2021-07-10 DIAGNOSIS — Z130 Encounter for screening for diseases of the blood and blood-forming organs and certain disorders involving the immune mechanism: Secondary | ICD-10-CM | POA: Diagnosis not present

## 2021-07-10 DIAGNOSIS — Z13220 Encounter for screening for lipoid disorders: Secondary | ICD-10-CM | POA: Diagnosis not present

## 2021-07-10 DIAGNOSIS — Z131 Encounter for screening for diabetes mellitus: Secondary | ICD-10-CM | POA: Diagnosis not present

## 2021-08-07 DIAGNOSIS — H401122 Primary open-angle glaucoma, left eye, moderate stage: Secondary | ICD-10-CM | POA: Diagnosis not present

## 2021-08-07 DIAGNOSIS — H401113 Primary open-angle glaucoma, right eye, severe stage: Secondary | ICD-10-CM | POA: Diagnosis not present

## 2021-08-07 DIAGNOSIS — H01009 Unspecified blepharitis unspecified eye, unspecified eyelid: Secondary | ICD-10-CM | POA: Diagnosis not present

## 2021-09-14 DIAGNOSIS — H401122 Primary open-angle glaucoma, left eye, moderate stage: Secondary | ICD-10-CM | POA: Diagnosis not present

## 2021-09-14 DIAGNOSIS — H401113 Primary open-angle glaucoma, right eye, severe stage: Secondary | ICD-10-CM | POA: Diagnosis not present

## 2021-09-30 DIAGNOSIS — H401113 Primary open-angle glaucoma, right eye, severe stage: Secondary | ICD-10-CM | POA: Diagnosis not present

## 2021-09-30 DIAGNOSIS — H401122 Primary open-angle glaucoma, left eye, moderate stage: Secondary | ICD-10-CM | POA: Diagnosis not present

## 2021-09-30 DIAGNOSIS — H25813 Combined forms of age-related cataract, bilateral: Secondary | ICD-10-CM | POA: Diagnosis not present

## 2021-10-26 DIAGNOSIS — R739 Hyperglycemia, unspecified: Secondary | ICD-10-CM | POA: Diagnosis not present

## 2021-11-18 DIAGNOSIS — M818 Other osteoporosis without current pathological fracture: Secondary | ICD-10-CM | POA: Diagnosis not present

## 2021-12-21 DIAGNOSIS — M818 Other osteoporosis without current pathological fracture: Secondary | ICD-10-CM | POA: Diagnosis not present

## 2021-12-21 DIAGNOSIS — L7211 Pilar cyst: Secondary | ICD-10-CM | POA: Diagnosis not present

## 2021-12-21 DIAGNOSIS — D225 Melanocytic nevi of trunk: Secondary | ICD-10-CM | POA: Diagnosis not present

## 2021-12-21 DIAGNOSIS — D2272 Melanocytic nevi of left lower limb, including hip: Secondary | ICD-10-CM | POA: Diagnosis not present

## 2021-12-21 DIAGNOSIS — Z808 Family history of malignant neoplasm of other organs or systems: Secondary | ICD-10-CM | POA: Diagnosis not present

## 2022-01-12 DIAGNOSIS — H401113 Primary open-angle glaucoma, right eye, severe stage: Secondary | ICD-10-CM | POA: Diagnosis not present

## 2022-01-12 DIAGNOSIS — H25813 Combined forms of age-related cataract, bilateral: Secondary | ICD-10-CM | POA: Diagnosis not present

## 2022-01-12 DIAGNOSIS — H401122 Primary open-angle glaucoma, left eye, moderate stage: Secondary | ICD-10-CM | POA: Diagnosis not present

## 2022-01-13 DIAGNOSIS — H401113 Primary open-angle glaucoma, right eye, severe stage: Secondary | ICD-10-CM | POA: Diagnosis not present

## 2022-01-13 DIAGNOSIS — H25813 Combined forms of age-related cataract, bilateral: Secondary | ICD-10-CM | POA: Diagnosis not present

## 2022-01-13 DIAGNOSIS — H401122 Primary open-angle glaucoma, left eye, moderate stage: Secondary | ICD-10-CM | POA: Diagnosis not present

## 2022-02-17 DIAGNOSIS — H401113 Primary open-angle glaucoma, right eye, severe stage: Secondary | ICD-10-CM | POA: Diagnosis not present

## 2022-02-17 DIAGNOSIS — H25813 Combined forms of age-related cataract, bilateral: Secondary | ICD-10-CM | POA: Diagnosis not present

## 2022-02-17 DIAGNOSIS — H401122 Primary open-angle glaucoma, left eye, moderate stage: Secondary | ICD-10-CM | POA: Diagnosis not present

## 2022-03-16 DIAGNOSIS — H401131 Primary open-angle glaucoma, bilateral, mild stage: Secondary | ICD-10-CM | POA: Diagnosis not present

## 2022-03-31 IMAGING — US US THYROID
1 series · 14 of 25 positions shown · non-contrast
Comparison: None.

CLINICAL DATA: Thyroid nodule

EXAM:
THYROID ULTRASOUND
TECHNIQUE: Ultrasound examination of the thyroid gland and adjacent soft
tissues was performed.

[Series 1: us thyroid · 0.04mm/px · 14 of 61 slices shown]
[im 1/61]
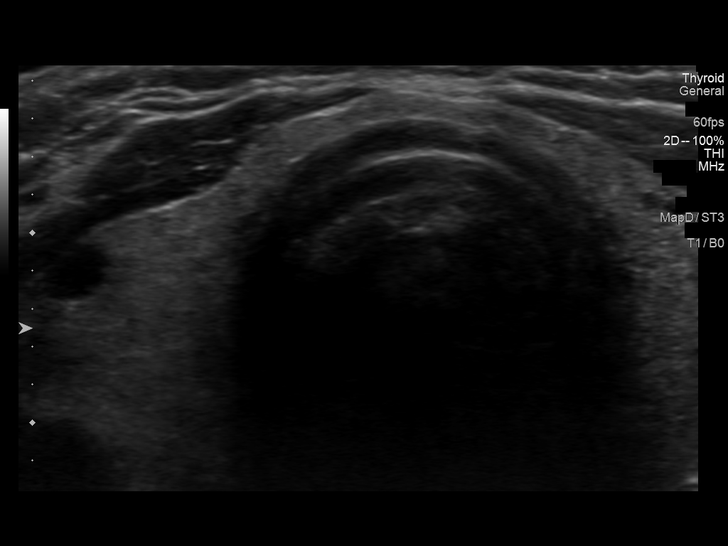
[im 6/61]
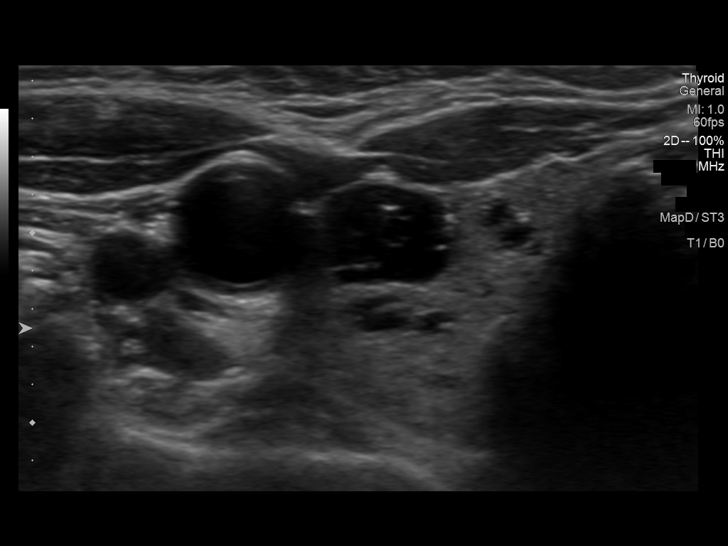
[im 11/61]
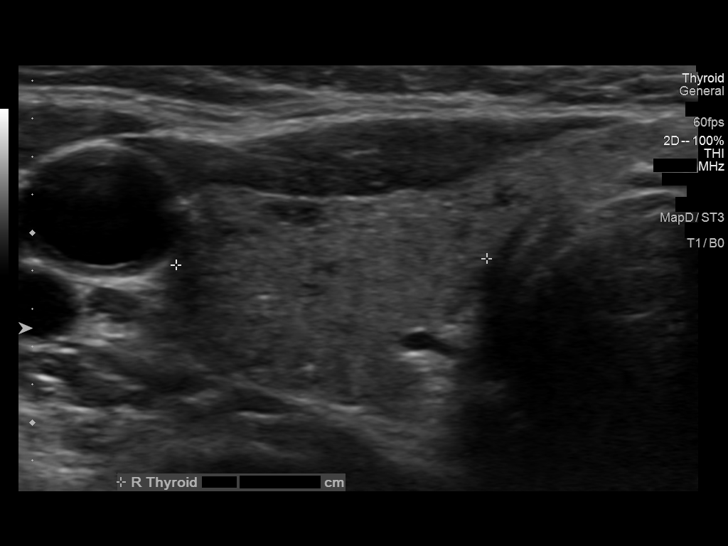
[im 16/61]
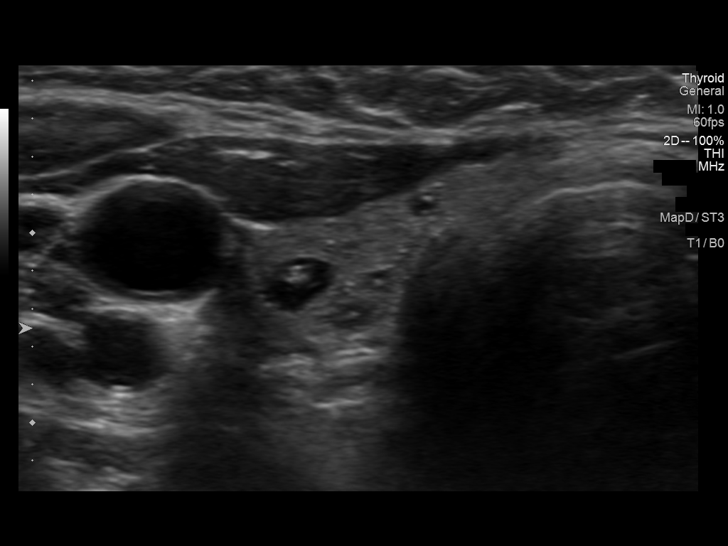
[im 21/61]
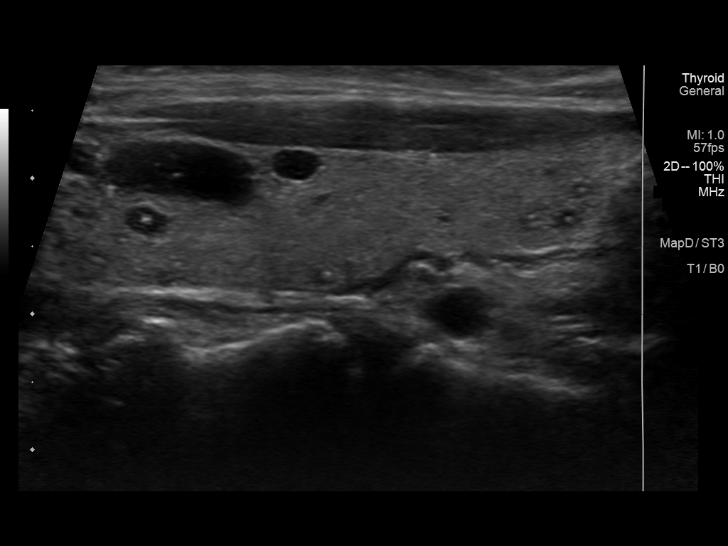
[im 23/61]
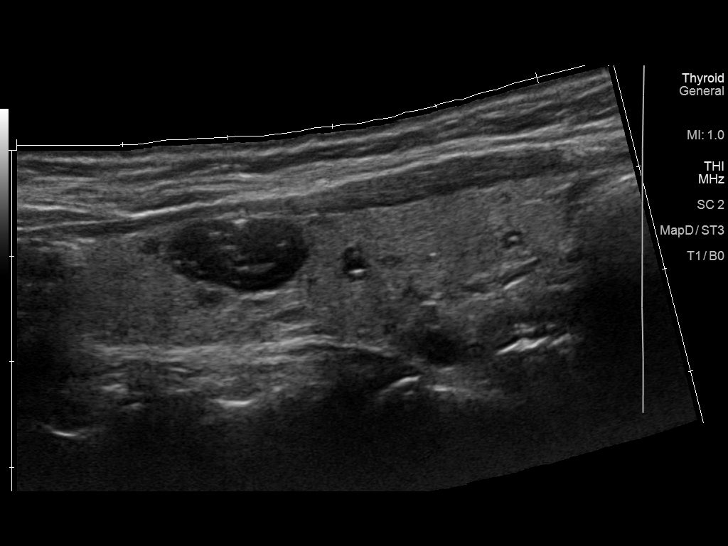
[im 28/61]
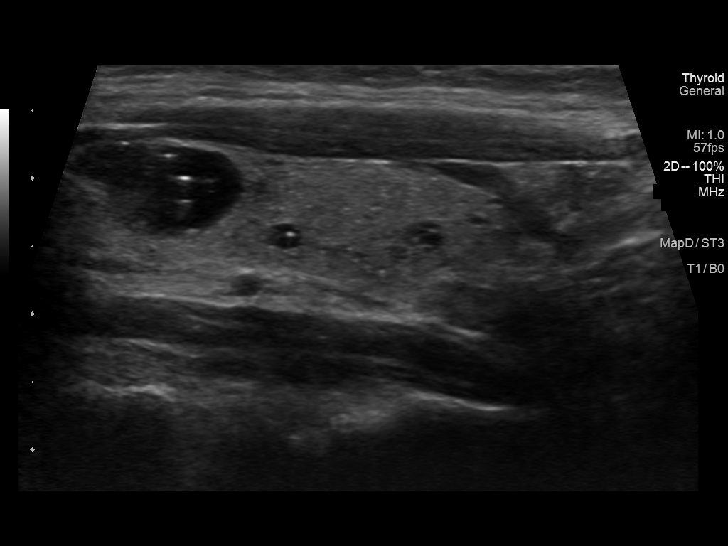
[im 33/61]
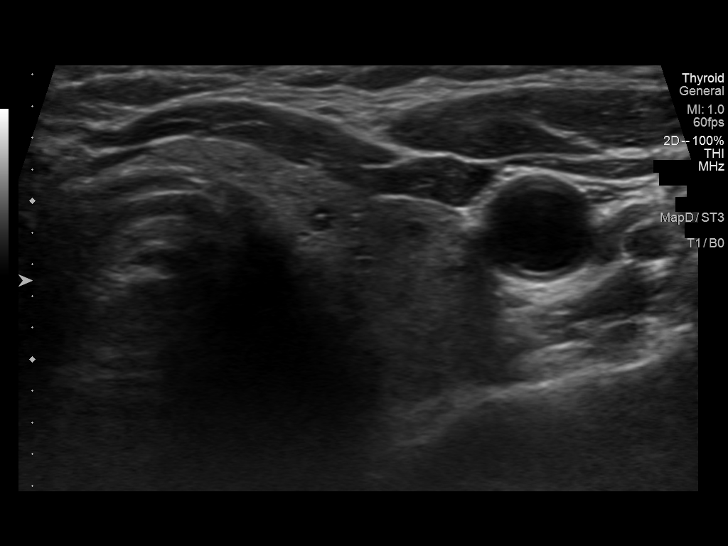
[im 38/61]
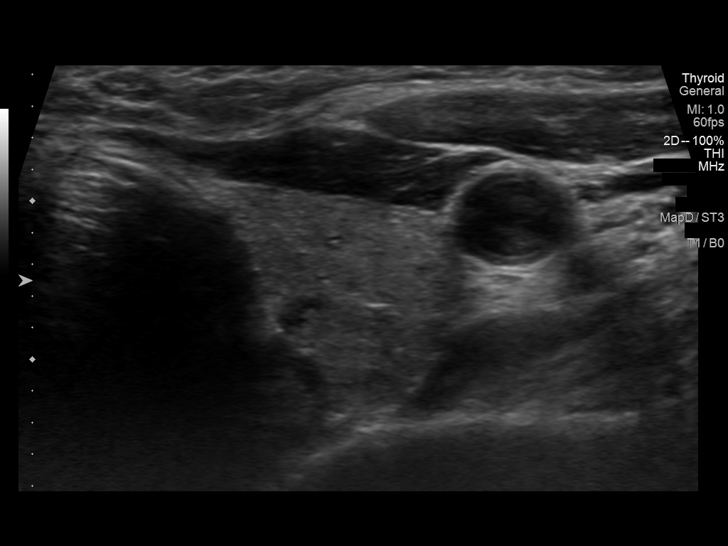
[im 41/61]
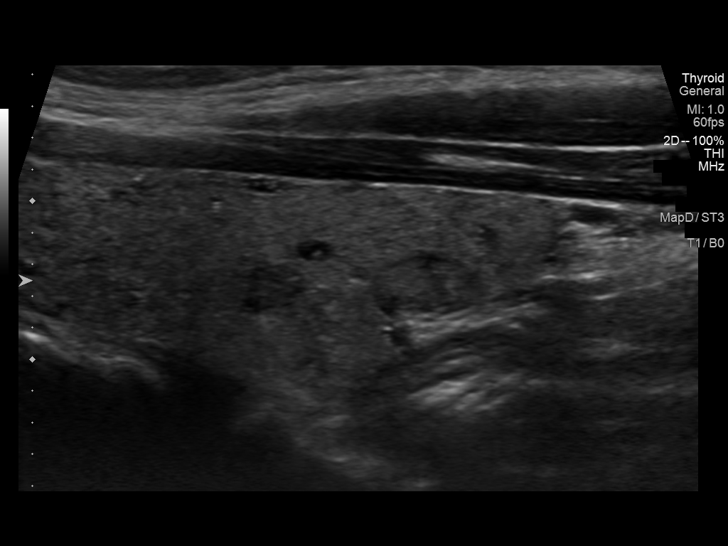
[im 46/61]
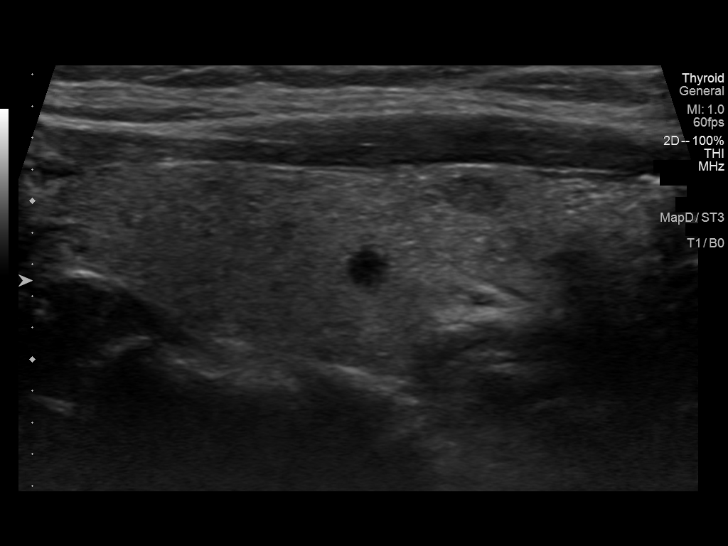
[im 51/61]
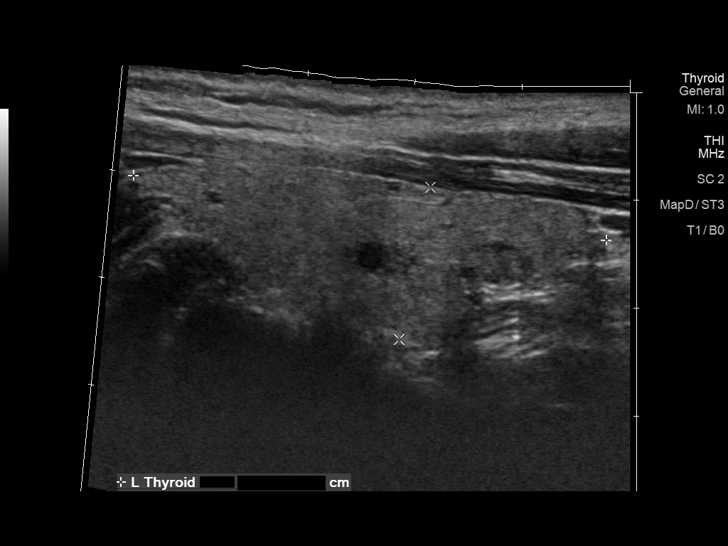
[im 56/61]
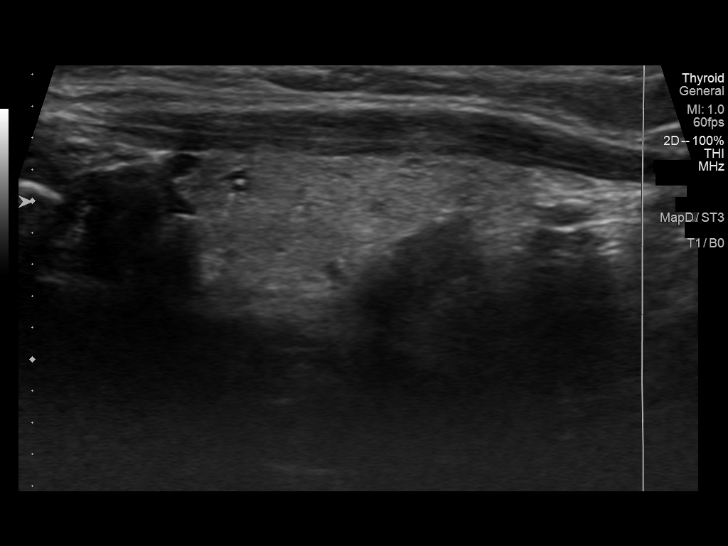
[im 61/61]
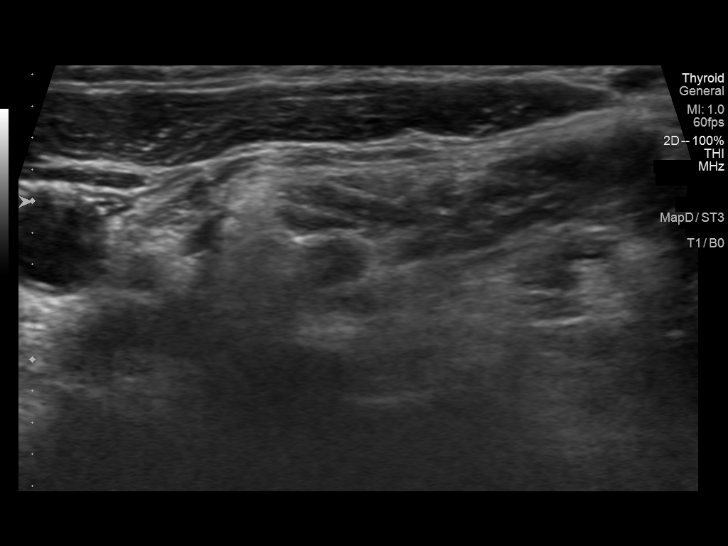

[14 of 25 positions shown; findings below may reference images not displayed]

FINDINGS: Parenchymal Echotexture: Mildly heterogeneous

Isthmus: 0.2 cm

Right lobe: 4.9 x 1.6 x 1.6 cm

Left lobe: 4.4 x 1.4 x 1.6 cm

_________________________________________________________

Estimated total number of nodules >/= 1 cm: 1

Number of spongiform nodules >/=  2 cm not described below (TR1): 0

Number of mixed cystic and solid nodules >/= 1.5 cm not described
below (TR2): 0

_________________________________________________________

Nodule 1: 1.3 x 0.7 x 0.5 cm cystic nodule in the superior right
thyroid lobe does not meet criteria for imaging surveillance or FNA.

_________________________________________________________

Nodule 2: 0.7 x 0.6 x 0.4 cm solid isoechoic left inferior thyroid
nodule does not meet criteria for imaging surveillance or FNA.
IMPRESSION: Multiple bilateral thyroid nodules, which do not meet criteria for
FNA or imaging follow-up.

The above is in keeping with the ACR TI-RADS recommendations - [HOSPITAL] 8222;[DATE].

## 2022-04-07 DIAGNOSIS — M810 Age-related osteoporosis without current pathological fracture: Secondary | ICD-10-CM | POA: Diagnosis not present

## 2022-04-07 DIAGNOSIS — Z13820 Encounter for screening for osteoporosis: Secondary | ICD-10-CM | POA: Diagnosis not present

## 2022-04-20 DIAGNOSIS — L7211 Pilar cyst: Secondary | ICD-10-CM | POA: Diagnosis not present

## 2022-04-20 DIAGNOSIS — Z1211 Encounter for screening for malignant neoplasm of colon: Secondary | ICD-10-CM | POA: Diagnosis not present

## 2022-06-09 DIAGNOSIS — M818 Other osteoporosis without current pathological fracture: Secondary | ICD-10-CM | POA: Diagnosis not present

## 2022-06-23 DIAGNOSIS — H401113 Primary open-angle glaucoma, right eye, severe stage: Secondary | ICD-10-CM | POA: Diagnosis not present

## 2022-06-23 DIAGNOSIS — H25813 Combined forms of age-related cataract, bilateral: Secondary | ICD-10-CM | POA: Diagnosis not present

## 2022-06-23 DIAGNOSIS — H401122 Primary open-angle glaucoma, left eye, moderate stage: Secondary | ICD-10-CM | POA: Diagnosis not present

## 2022-07-13 DIAGNOSIS — Z681 Body mass index (BMI) 19 or less, adult: Secondary | ICD-10-CM | POA: Diagnosis not present

## 2022-07-13 DIAGNOSIS — M810 Age-related osteoporosis without current pathological fracture: Secondary | ICD-10-CM | POA: Diagnosis not present

## 2022-07-13 DIAGNOSIS — Z01419 Encounter for gynecological examination (general) (routine) without abnormal findings: Secondary | ICD-10-CM | POA: Diagnosis not present

## 2022-07-13 DIAGNOSIS — Z1231 Encounter for screening mammogram for malignant neoplasm of breast: Secondary | ICD-10-CM | POA: Diagnosis not present

## 2022-10-14 DIAGNOSIS — Z1321 Encounter for screening for nutritional disorder: Secondary | ICD-10-CM | POA: Diagnosis not present

## 2022-10-14 DIAGNOSIS — Z131 Encounter for screening for diabetes mellitus: Secondary | ICD-10-CM | POA: Diagnosis not present

## 2022-10-14 DIAGNOSIS — Z13220 Encounter for screening for lipoid disorders: Secondary | ICD-10-CM | POA: Diagnosis not present

## 2022-10-14 DIAGNOSIS — M818 Other osteoporosis without current pathological fracture: Secondary | ICD-10-CM | POA: Diagnosis not present

## 2022-10-14 DIAGNOSIS — Z13228 Encounter for screening for other metabolic disorders: Secondary | ICD-10-CM | POA: Diagnosis not present

## 2022-10-14 DIAGNOSIS — Z1329 Encounter for screening for other suspected endocrine disorder: Secondary | ICD-10-CM | POA: Diagnosis not present

## 2022-12-27 DIAGNOSIS — H25813 Combined forms of age-related cataract, bilateral: Secondary | ICD-10-CM | POA: Diagnosis not present

## 2022-12-27 DIAGNOSIS — H401122 Primary open-angle glaucoma, left eye, moderate stage: Secondary | ICD-10-CM | POA: Diagnosis not present

## 2022-12-27 DIAGNOSIS — H401113 Primary open-angle glaucoma, right eye, severe stage: Secondary | ICD-10-CM | POA: Diagnosis not present

## 2023-03-23 ENCOUNTER — Other Ambulatory Visit: Payer: Self-pay

## 2023-03-23 DIAGNOSIS — M810 Age-related osteoporosis without current pathological fracture: Secondary | ICD-10-CM | POA: Insufficient documentation

## 2023-03-24 ENCOUNTER — Telehealth: Payer: Self-pay

## 2023-03-24 NOTE — Telephone Encounter (Signed)
 Dr. Arelia Sneddon, patient will be scheduled as soon as possible.  Auth Submission: APPROVED Site of care: Site of care: [facility] Payer: BCBS commercial Medication & CPT/J Code(s) submitted: Prolia (Denosumab) [id number] Route of submission (phone, fax, portal): fax  Phone # Fax # [phone]  Auth type: Buy/Bill PB Units/visits requested: 60mg  x 2 doses Reference number: [id number] Approval from: 03/23/23 to 03/22/24

## 2023-04-08 ENCOUNTER — Ambulatory Visit: Payer: BC Managed Care – PPO

## 2023-04-08 VITALS — BP 116/76 | HR 64 | Temp 97.1°F | Resp 20 | Ht 69.0 in | Wt 134.2 lb

## 2023-04-08 DIAGNOSIS — M810 Age-related osteoporosis without current pathological fracture: Secondary | ICD-10-CM | POA: Diagnosis not present

## 2023-04-08 MED ORDER — DENOSUMAB 60 MG/ML ~~LOC~~ SOSY
60.0000 mg | PREFILLED_SYRINGE | Freq: Once | SUBCUTANEOUS | Status: AC
  Administered 2023-04-08: 60 mg via SUBCUTANEOUS

## 2023-04-08 NOTE — Progress Notes (Signed)
 Diagnosis: Osteoporosis  Provider:  Chilton Greathouse MD  Procedure: Injection  Prolia (Denosumab), Dose: 60 mg, Site: subcutaneous, Number of injections: 1  Injection Site(s): Left arm  Post Care: Observation period completed and Peripheral IV Discontinued  Discharge: Condition: Good, Destination: Home . AVS Provided  Performed by:  Loney Hering, LPN

## 2023-10-10 ENCOUNTER — Ambulatory Visit (INDEPENDENT_AMBULATORY_CARE_PROVIDER_SITE_OTHER)

## 2023-10-10 VITALS — BP 120/76 | HR 71 | Temp 97.0°F | Resp 18 | Ht 68.75 in | Wt 140.6 lb

## 2023-10-10 DIAGNOSIS — M810 Age-related osteoporosis without current pathological fracture: Secondary | ICD-10-CM

## 2023-10-10 MED ORDER — DENOSUMAB 60 MG/ML ~~LOC~~ SOSY
60.0000 mg | PREFILLED_SYRINGE | Freq: Once | SUBCUTANEOUS | Status: AC
  Administered 2023-10-10: 60 mg via SUBCUTANEOUS
  Filled 2023-10-10: qty 1

## 2023-10-10 NOTE — Progress Notes (Signed)
 Diagnosis: Osteoporosis  Provider:  Chilton Greathouse MD  Procedure: Injection  Prolia (Denosumab), Dose: 60 mg, Site: subcutaneous, Number of injections: 1  Injection Site(s): Right arm  Post Care: right arm injection  Discharge: Condition: Good, Destination: Home . AVS Declined  Performed by:  Rico Ala, LPN

## 2024-04-09 ENCOUNTER — Ambulatory Visit
